# Patient Record
Sex: Female | Born: 1985
Health system: Southern US, Community
[De-identification: ages and names within clinical notes are randomized; demographics above are authoritative.]

## PROBLEM LIST (undated history)

## (undated) ENCOUNTER — Inpatient Hospital Stay (HOSPITAL_COMMUNITY): Payer: Self-pay

## (undated) ENCOUNTER — Emergency Department (HOSPITAL_COMMUNITY): Admission: EM | Payer: Commercial Indemnity | Source: Home / Self Care

## (undated) DIAGNOSIS — K219 Gastro-esophageal reflux disease without esophagitis: Secondary | ICD-10-CM

## (undated) DIAGNOSIS — E669 Obesity, unspecified: Secondary | ICD-10-CM

## (undated) DIAGNOSIS — E0500 Thyrotoxicosis with diffuse goiter without thyrotoxic crisis or storm: Secondary | ICD-10-CM

## (undated) DIAGNOSIS — D649 Anemia, unspecified: Secondary | ICD-10-CM

## (undated) DIAGNOSIS — G4730 Sleep apnea, unspecified: Secondary | ICD-10-CM

## (undated) DIAGNOSIS — F419 Anxiety disorder, unspecified: Secondary | ICD-10-CM

## (undated) HISTORY — PX: CHOLECYSTECTOMY: SHX55

## (undated) HISTORY — DX: Sleep apnea, unspecified: G47.30

## (undated) HISTORY — DX: Anxiety disorder, unspecified: F41.9

## (undated) HISTORY — DX: Anemia, unspecified: D64.9

## (undated) HISTORY — PX: TONSILLECTOMY AND ADENOIDECTOMY: SHX28

## (undated) HISTORY — PX: WISDOM TOOTH EXTRACTION: SHX21

## (undated) HISTORY — PX: LAPAROSCOPIC GASTRIC SLEEVE RESECTION: SHX5895

## (undated) HISTORY — PX: TONSILLECTOMY: SUR1361

## (undated) HISTORY — DX: Gastro-esophageal reflux disease without esophagitis: K21.9

---
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
## 2007-03-04 ENCOUNTER — Emergency Department (HOSPITAL_COMMUNITY): Admission: EM | Admit: 2007-03-04 | Discharge: 2007-03-04 | Payer: Self-pay | Admitting: *Deleted

## 2007-03-06 ENCOUNTER — Encounter (HOSPITAL_COMMUNITY): Admission: RE | Admit: 2007-03-06 | Discharge: 2007-04-03 | Payer: Self-pay | Admitting: *Deleted

## 2007-04-02 ENCOUNTER — Encounter (INDEPENDENT_AMBULATORY_CARE_PROVIDER_SITE_OTHER): Payer: Self-pay | Admitting: Surgery

## 2007-04-02 ENCOUNTER — Ambulatory Visit (HOSPITAL_COMMUNITY): Admission: RE | Admit: 2007-04-02 | Discharge: 2007-04-02 | Payer: Self-pay | Admitting: Surgery

## 2008-01-13 ENCOUNTER — Other Ambulatory Visit: Admission: RE | Admit: 2008-01-13 | Discharge: 2008-01-13 | Payer: Self-pay | Admitting: Obstetrics and Gynecology

## 2008-04-26 IMAGING — NM NM HEPATO W/GB/PHARM/[PERSON_NAME]
2 series · 12 of 12 positions shown · non-contrast
Comparison: none

HISTORY: Right upper quadrant pain

[Series 1: hepatobiliary · 3.20mm/px · 6 of 60 frames shown (1 of 2)]
[frame 6/60]
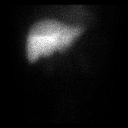
[frame 16/60]
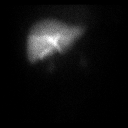
[frame 26/60]
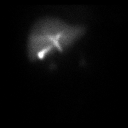
[frame 36/60]
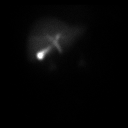
[frame 46/60]
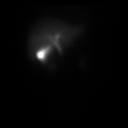
[frame 56/60]
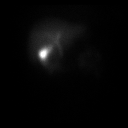

[Series 1: hepatobiliary · 3.20mm/px · 6 of 60 frames shown (2 of 2)]
[frame 6/60]
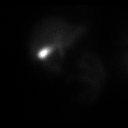
[frame 16/60]
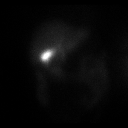
[frame 26/60]
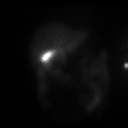
[frame 36/60]
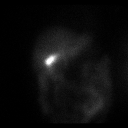
[frame 46/60]
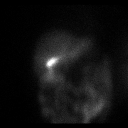
[frame 56/60]
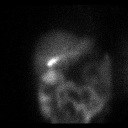

[12 of 12 positions shown; findings below may reference images not displayed]

HEPATOBILIARY SCAN WITH EJECTION FRACTION:

Hepatobiliary imaging performed using 5 mCi 6c-HHm mebrofenin.

Prompt tracer extraction from blood stream, indicating normal hepatocellular
function.
Prompt excretion of tracer into biliary tree.
Gallbladder visualized x 22 minutes.
Small bowel activity identified x 53 minutes. 
No focal hepatic retention of tracer.

At 1 hour, patient ingested half-and-half, and imaging was continued for 60
minutes.
Normal emptying of tracer occurs from gallbladder following fatty meal
stimulation.
Calculated gallbladder ejection fraction 60%, normal.
IMPRESSION: Normal exam.

## 2009-03-24 ENCOUNTER — Other Ambulatory Visit: Admission: RE | Admit: 2009-03-24 | Discharge: 2009-03-24 | Payer: Self-pay | Admitting: Obstetrics and Gynecology

## 2010-05-13 ENCOUNTER — Other Ambulatory Visit (HOSPITAL_COMMUNITY)
Admission: RE | Admit: 2010-05-13 | Discharge: 2010-05-13 | Disposition: A | Discharge status: Home / Self Care | Payer: Commercial Indemnity | Source: Ambulatory Visit | Attending: Obstetrics and Gynecology | Admitting: Obstetrics and Gynecology

## 2010-05-13 ENCOUNTER — Other Ambulatory Visit: Payer: Self-pay | Admitting: Obstetrics and Gynecology

## 2010-05-13 DIAGNOSIS — Z01419 Encounter for gynecological examination (general) (routine) without abnormal findings: Secondary | ICD-10-CM | POA: Insufficient documentation

## 2010-05-13 DIAGNOSIS — Z113 Encounter for screening for infections with a predominantly sexual mode of transmission: Secondary | ICD-10-CM | POA: Insufficient documentation

## 2010-08-16 NOTE — Op Note (Signed)
Jennifer Rosales, Jennifer Rosales               ACCOUNT NO.:  [id number]   MEDICAL RECORD NO.:  [id number]          PATIENT TYPE:  AMB   LOCATION:  SDS                          FACILITY:  [facility]   PHYSICIAN:  Ardeth Sportsman, MD     DATE OF BIRTH:  1985-11-11   DATE OF PROCEDURE:  DATE OF DISCHARGE:                               OPERATIVE REPORT   PRIMARY CARE PHYSICIAN:  Bennie Pierini, Nurse Practitioner for  Danise Edge at [facility] at [place].   SURGEON:  Karie Soda, M.D.   PREOPERATIVE DIAGNOSIS:  Biliary dyskinesia.   POSTOPERATIVE DIAGNOSIS:  1. Biliary dyskinesia.  2. Fatty steatohepatosis.   PROCEDURE PERFORMED:  Laparoscopic cholecystectomy   ANESTHESIA:  1. General anesthesia.  2. Local anesthetic in a field block around port sites.   SPECIMEN:  Gallbladder.   DRAINS:  None.   ESTIMATED BLOOD LOSS:  Less than 10 ml.   COMPLICATIONS:  None apparent.   INDICATIONS:  Ms. Cliffton Asters is a 21-year female with a classic story of  biliary colic with a documented decreased gallbladder ejection fraction  of 20% with reproduction of symptoms on HIDA scan.  Differential  diagnoses were discussed.  Options were discussed, recommendation was  made for a laparoscopic cholecystectomy with possible intraoperative  cholangiogram.  Risks such as stroke, heart attack, deep venous  thrombosis, pulmonary embolism and death were discussed.  Risks such as  bleeding, need for transfusion, wound infection, abscess, injury to  other organs, prolonged pain and other risks were discussed.  Questions  were answered and she agreed to proceed.   OPERATIVE FINDINGS:  She had some fatty steatohepatosis of her liver  that was mild to moderate.  Her gallbladder appeared normal.  Her cystic  duct was extremely narrow and fibrotic and I could not get a 5-French  cholangiocatheter to pass more proximally into the common bile duct.  Given the normal liver function tests, I did not force  it and aborted on  cholangiogram.   PROCEDURE:  Informed consent was confirmed.  The patient had sequential  compression devices active during the entire case.  She voided just  prior to going to the operating room.  She was positioned supine, both  arms tucked.  She underwent general esthesia without any difficulty.  Her abdomen was prepped and draped in sterile fashion.   Entry was gained in the abdomen with placement of a 5-mm port in the  right upper quadrant using optical entry technique with the patient in  steep reversed Trendelenburg and right side up.  Capnoperitoneum 15  mmHg, provided good abdominal insufflation.  Under direct visualization,  a 5-mm port was placed in the right lateral flank and supraumbilically  through a prior supraumbilical incision from a prior attempted  bellybutton piercing.  A 10-mm port was tunneled through the falciform  ligament just right of the subxiphoid region.   The gallbladder fundus was grasped and elevated cephalad.  Peritoneal  coverings between the gallbladder and the liver were freed off in the  anteromedial postal aspects.  There were small nicks and there was some  drainage.  There was clear bile, but no stones were apparent.  I  aspirated the gallbladder for a fully decompressed gallbladder.  Circumferential dissection was done at the infundibulum to reveal just  two structures going from the gallbladder down to the porta hepatis  consistent with a classic critical view.  One clip on the gallbladder  side, two clips slightly proximal remained on the cystic artery and  transection was completed.  A small branch from the cystic artery going  to the cystic duct was clipped and transected as well.  This just left  one structure going down to the porta hepatis consistent a cystic duct.   A clip was made on the infundibulum and a partial cysticotomy was  performed.  I ended up having to do a nearly complete transection of it.  I could get  a hook catheter to be able to pass more distally up into the  infundibulum well but I could barely get it to go more proximally.  After some careful gentle dilation, I felt like I could be able to do a  cholangiogram.  A 5-French cholangiogram catheter was placed in the  right subcostal puncture site, flushed and attempted to pass into the  cystic duct.  Despite numerous maneuverings and attempts, I could not  get it through that.  Actually, I ended up back walling  through the  posterior wall of cystic duct that remainder.  Given the fact she had  normal liver function tests, and the anatomy was very easy to see and I  had a good critical view, I aborted on cholangiogram to avoid further  injury.  Therefore, four clips remained on the cystic duct just slightly  proximal and the partial cysticotomy and cystic duct transection were  completed.   The gallbladder was freed from its main attachments on the liver and  brought out inside the subxiphoid port after being completely  decompressed.  Hemostasis was assured in liver bed.  Clips were intact,  in the cystic duct and arterial stumps.  Over a liter of irrigation was  done with clear return and there was no evidence of bile contamination,  no leaking of blood.  Gas was aspirated and ports were removed.  The  fascial defect in the subxiphoid region was too small to allow my pinky  to pass and was tunnelled within the falciform ligament, so I did not do  any more aggressive closure since it was a dilating trocar.  Skin was  closed using 4-0 Monocryl stitch.  Sterile dressings were applied.  The  patient was extubated, sent to the recovery room in stable condition.   I explained the operative findings to the patient's mother,  postoperative instructions had been discussed with the patient and her  mother just prior to surgery and reinforced with the mother after  surgery.  She expressed her appreciation.      Ardeth Sportsman, MD   Electronically Signed     SCG/MEDQ  D:  04/02/2007  T:  04/02/2007  Job:  [id number]   cc:   Ardeth Sportsman, MD

## 2011-01-06 LAB — CBC
HCT: 41.1
Hemoglobin: 13.8
MCHC: 33.5
MCV: 79.8
Platelets: 320
RBC: 5.15 — ABNORMAL HIGH
RDW: 14.7
WBC: 7.7

## 2011-07-20 ENCOUNTER — Other Ambulatory Visit: Payer: Self-pay | Admitting: Obstetrics and Gynecology

## 2011-07-20 ENCOUNTER — Other Ambulatory Visit (HOSPITAL_COMMUNITY)
Admission: RE | Admit: 2011-07-20 | Discharge: 2011-07-20 | Disposition: A | Discharge status: Home / Self Care | Payer: 59 | Source: Ambulatory Visit | Attending: Obstetrics and Gynecology | Admitting: Obstetrics and Gynecology

## 2011-07-20 DIAGNOSIS — Z01419 Encounter for gynecological examination (general) (routine) without abnormal findings: Secondary | ICD-10-CM | POA: Insufficient documentation

## 2011-07-20 DIAGNOSIS — Z113 Encounter for screening for infections with a predominantly sexual mode of transmission: Secondary | ICD-10-CM | POA: Insufficient documentation

## 2012-07-25 ENCOUNTER — Other Ambulatory Visit: Payer: Self-pay | Admitting: Obstetrics and Gynecology

## 2012-07-25 ENCOUNTER — Other Ambulatory Visit (HOSPITAL_COMMUNITY)
Admission: RE | Admit: 2012-07-25 | Discharge: 2012-07-25 | Disposition: A | Discharge status: Home / Self Care | Payer: 59 | Source: Ambulatory Visit | Attending: Obstetrics and Gynecology | Admitting: Obstetrics and Gynecology

## 2012-07-25 DIAGNOSIS — Z01419 Encounter for gynecological examination (general) (routine) without abnormal findings: Secondary | ICD-10-CM | POA: Insufficient documentation

## 2012-07-25 DIAGNOSIS — Z113 Encounter for screening for infections with a predominantly sexual mode of transmission: Secondary | ICD-10-CM | POA: Insufficient documentation

## 2012-08-13 ENCOUNTER — Other Ambulatory Visit (HOSPITAL_COMMUNITY)
Admission: RE | Admit: 2012-08-13 | Discharge: 2012-08-13 | Disposition: A | Discharge status: Home / Self Care | Payer: 59 | Source: Ambulatory Visit | Attending: Obstetrics and Gynecology | Admitting: Obstetrics and Gynecology

## 2012-12-06 LAB — OB RESULTS CONSOLE RPR: RPR: NONREACTIVE

## 2012-12-06 LAB — OB RESULTS CONSOLE ABO/RH: RH Type: POSITIVE

## 2012-12-06 LAB — OB RESULTS CONSOLE RUBELLA ANTIBODY, IGM: Rubella: IMMUNE

## 2012-12-06 LAB — OB RESULTS CONSOLE HIV ANTIBODY (ROUTINE TESTING): HIV: NONREACTIVE

## 2012-12-06 LAB — OB RESULTS CONSOLE ANTIBODY SCREEN: Antibody Screen: NEGATIVE

## 2012-12-06 LAB — OB RESULTS CONSOLE HEPATITIS B SURFACE ANTIGEN: Hepatitis B Surface Ag: NEGATIVE

## 2013-01-03 LAB — OB RESULTS CONSOLE GC/CHLAMYDIA
Chlamydia: NEGATIVE
Gonorrhea: NEGATIVE

## 2013-04-03 NOTE — L&D Delivery Note (Signed)
Delivery Note At 5:54 PM a viable female was delivered via Vaginal, Spontaneous Delivery (Presentation: ; Occiput Anterior).  Nuchal cord x 1 reduced Mouth and nose bulb suctioned.  APGAR: 9, 9; weight .   Placenta status: , .  Cord: 3 vessels with the following complications: None.  Cord pH: NA  Anesthesia: Epidural  Episiotomy: None Lacerations: 2nd degree;Perineal Suture Repair: 3.0 vicryl Est. Blood Loss (mL): 800 mL  Methergine 0.2 mg IM and 1000 mcg of cytotec per rectum for PPH  Mom to postpartum.  Baby to Couplet care / Skin to Skin.  Dorien Chihuahuaara J. Amayah Staheli 07/30/2013, 6:29 PM

## 2013-06-15 ENCOUNTER — Inpatient Hospital Stay (HOSPITAL_COMMUNITY)
Admission: AD | Admit: 2013-06-15 | Discharge: 2013-06-15 | Disposition: A | Discharge status: Home / Self Care | Payer: Commercial Managed Care - PPO | Source: Ambulatory Visit | Attending: Obstetrics and Gynecology | Admitting: Obstetrics and Gynecology

## 2013-06-15 ENCOUNTER — Encounter (HOSPITAL_COMMUNITY): Payer: Self-pay | Admitting: *Deleted

## 2013-06-15 DIAGNOSIS — O4700 False labor before 37 completed weeks of gestation, unspecified trimester: Secondary | ICD-10-CM | POA: Insufficient documentation

## 2013-06-15 DIAGNOSIS — M545 Low back pain, unspecified: Secondary | ICD-10-CM | POA: Insufficient documentation

## 2013-06-15 DIAGNOSIS — O368190 Decreased fetal movements, unspecified trimester, not applicable or unspecified: Secondary | ICD-10-CM | POA: Insufficient documentation

## 2013-06-15 HISTORY — DX: Obesity, unspecified: E66.9

## 2013-06-15 LAB — URINALYSIS, ROUTINE W REFLEX MICROSCOPIC
Bilirubin Urine: NEGATIVE
Glucose, UA: NEGATIVE mg/dL
Hgb urine dipstick: NEGATIVE
Ketones, ur: NEGATIVE mg/dL
Nitrite: NEGATIVE
Protein, ur: NEGATIVE mg/dL
Specific Gravity, Urine: 1.01 (ref 1.005–1.030)
Urobilinogen, UA: 0.2 mg/dL (ref 0.0–1.0)
pH: 7.5 (ref 5.0–8.0)

## 2013-06-15 LAB — WET PREP, GENITAL
Clue Cells Wet Prep HPF POC: NONE SEEN
Trich, Wet Prep: NONE SEEN
Yeast Wet Prep HPF POC: NONE SEEN

## 2013-06-15 LAB — URINE MICROSCOPIC-ADD ON

## 2013-06-15 MED ORDER — LACTATED RINGERS IV BOLUS (SEPSIS): 1000.0000 mL | Freq: Once | INTRAVENOUS | Status: DC

## 2013-06-15 MED ORDER — LACTATED RINGERS IV BOLUS (SEPSIS)
1000.0000 mL | Freq: Once | INTRAVENOUS | Status: AC
  Administered 2013-06-15: 1000 mL via INTRAVENOUS

## 2013-06-15 MED ORDER — NIFEDIPINE 10 MG PO CAPS: 10.0000 mg | ORAL_CAPSULE | Freq: Four times a day (QID) | ORAL | Status: DC | PRN

## 2013-06-15 MED ORDER — NIFEDIPINE 10 MG PO CAPS
10.0000 mg | ORAL_CAPSULE | Freq: Once | ORAL | Status: AC
  Administered 2013-06-15: 10 mg via ORAL
  Filled 2013-06-15: qty 1

## 2013-06-15 NOTE — MAU Provider Note (Signed)
History     CSN: [id number]  Arrival date and time: 06/15/13 1129   First Provider Initiated Contact with Patient 06/15/13 1215      Chief Complaint  Patient presents with  . Back Pain  . Decreased Fetal Movement   HPI  Ms. Jennifer Rosales is a 28 y.o. female G1P0 at [redacted]w[redacted]d who presents with lower back pain and decreased fetal movement. The back pain started last night and this morning she woke up with pelvic pain. She was feeling the pelvic pain every 15 mins. Since she has been here in MAU she has felt the baby move.  Currently she rates her back pain 5/10; pt rates the pelvic pain 5/10. She has not taken anything for pain; pt drinks a good amount of water during the day.  Pt had 6 episodes of soft, diarrhea yesterday; none today.   OB History   Grav Para Term Preterm Abortions TAB SAB Ect Mult Living   1               History reviewed. No pertinent past medical history.  History reviewed. No pertinent past surgical history.  History reviewed. No pertinent family history.  History  Substance Use Topics  . Smoking status: Never Smoker   . Smokeless tobacco: Not on file  . Alcohol Use: Not on file    Allergies: No Known Allergies  Prescriptions prior to admission  Medication Sig Dispense Refill  . Prenatal Vit-Fe Fumarate-FA (PRENATAL MULTIVITAMIN) TABS tablet Take 1 tablet by mouth daily at 12 noon.       Results for orders placed during the hospital encounter of 06/15/13 (from the past 48 hour(s))  URINALYSIS, ROUTINE W REFLEX MICROSCOPIC     Status: Abnormal   Collection Time    06/15/13 11:50 AM      Result Value Ref Range   Color, Urine YELLOW  YELLOW   APPearance CLEAR  CLEAR   Specific Gravity, Urine 1.010  1.005 - 1.030   pH 7.5  5.0 - 8.0   Glucose, UA NEGATIVE  NEGATIVE mg/dL   Hgb urine dipstick NEGATIVE  NEGATIVE   Bilirubin Urine NEGATIVE  NEGATIVE   Ketones, ur NEGATIVE  NEGATIVE mg/dL   Protein, ur NEGATIVE  NEGATIVE mg/dL   Urobilinogen, UA 0.2   0.0 - 1.0 mg/dL   Nitrite NEGATIVE  NEGATIVE   Leukocytes, UA TRACE (*) NEGATIVE  URINE MICROSCOPIC-ADD ON     Status: Abnormal   Collection Time    06/15/13 11:50 AM      Result Value Ref Range   Squamous Epithelial / LPF FEW (*) RARE   WBC, UA 0-2  <3 WBC/hpf   Bacteria, UA FEW (*) RARE  WET PREP, GENITAL     Status: Abnormal   Collection Time    06/15/13  1:50 PM      Result Value Ref Range   Yeast Wet Prep HPF POC NONE SEEN  NONE SEEN   Trich, Wet Prep NONE SEEN  NONE SEEN   Clue Cells Wet Prep HPF POC NONE SEEN  NONE SEEN   WBC, Wet Prep HPF POC FEW (*) NONE SEEN   Comment: MANY BACTERIA SEEN    Review of Systems  Genitourinary: Negative for dysuria, urgency, frequency and hematuria.       No vaginal discharge. No vaginal bleeding. No dysuria.   Musculoskeletal: Positive for back pain (More in the left side of her back and the center. The pain comes and goes. ).  Physical Exam   Blood pressure 104/56, pulse 84, temperature 98.4 F (36.9 C), resp. rate 18, height 5\' 4"  (1.626 m), weight 131.09 kg (289 lb).  Physical Exam  Constitutional: She appears well-developed and well-nourished. No distress.  HENT:  Head: Normocephalic.  Eyes: Pupils are equal, round, and reactive to light.  Neck: Neck supple.  GI: She exhibits no distension. There is no tenderness. There is no rebound, no guarding and no CVA tenderness.  Genitourinary:  Dilation: Fingertip Effacement (%): 50 Cervical Position: Anterior Presentation: Undeterminable Exam by:: J. Malaiyah Achorn NP  Skin: She is not diaphoretic.    Fetal Tracing: Baseline: 135 bpm  Variability: Moderate  Accelerations: 15x15 Decelerations: None Toco: Mild contractions with UI; Q 4-6 mins apart   Fetal Tracing: 1500 Baseline: 140 bpm  Variability: Moderate- Marked   Accelerations: 15x15 Decelerations: None Toco: Occasional UI     1530 cervical exam; unchanged from previous exam   MAU Course   Procedures None  MDM UA NST  Consulted with Dr. Richardson Doppole at 1325 Wet prep 1 Liter bolus  Procardia 10 mg PO  Consulted with Dr. Richardson Doppole at 1530 Pt feels the contractions have spaced out tremendously. She states she continues to feel an occasional contraction, however less frequently than prior.   Assessment and Plan   Assessment:  1. Threatened preterm labor     Plan:  Discharge home in stable condition RX: Procardia PRN  Call the office tomorrow if the pain resumes  Preterm labor precautions discussed Pelvic rest Kick counts   Jennifer HansenJennifer Irene Delaney Schnick, NP  06/15/2013, 4:00 PM

## 2013-06-15 NOTE — MAU Note (Signed)
Pt presents with complaints of lower back and pelvic pain for a couple of days and noticed a decrease in fetal movement today.

## 2013-06-15 NOTE — Discharge Instructions (Signed)
Pelvic Rest °Pelvic rest is sometimes recommended for women when:  °· The placenta is partially or completely covering the opening of the cervix (placenta previa). °· There is bleeding between the uterine wall and the amniotic sac in the first trimester (subchorionic hemorrhage). °· The cervix begins to open without labor starting (incompetent cervix, cervical insufficiency). °· The labor is too early (preterm labor). °HOME CARE INSTRUCTIONS °· Do not have sexual intercourse, stimulation, or an orgasm. °· Do not use tampons, douche, or put anything in the vagina. °· Do not lift anything over 10 pounds (4.5 kg). °· Avoid strenuous activity or straining your pelvic muscles. °SEEK MEDICAL CARE IF:  °· You have any vaginal bleeding during pregnancy. Treat this as a potential emergency. °· You have cramping pain felt low in the stomach (stronger than menstrual cramps). °· You notice vaginal discharge (watery, mucus, or bloody). °· You have a low, dull backache. °· There are regular contractions or uterine tightening. °SEEK IMMEDIATE MEDICAL CARE IF: °You have vaginal bleeding and have placenta previa.  °Document Released: 07/15/2010 Document Revised: 06/12/2011 Document Reviewed: 07/15/2010 °ExitCare® Patient Information ©2014 [employer]. ° °

## 2013-06-28 LAB — OB RESULTS CONSOLE GBS: GBS: NEGATIVE

## 2013-07-14 ENCOUNTER — Encounter (HOSPITAL_COMMUNITY): Payer: Self-pay | Admitting: *Deleted

## 2013-07-14 ENCOUNTER — Inpatient Hospital Stay (HOSPITAL_COMMUNITY)
Admission: AD | Admit: 2013-07-14 | Discharge: 2013-07-14 | Disposition: A | Discharge status: Home / Self Care | Payer: Commercial Managed Care - PPO | Source: Ambulatory Visit | Attending: Obstetrics and Gynecology | Admitting: Obstetrics and Gynecology

## 2013-07-14 DIAGNOSIS — O9989 Other specified diseases and conditions complicating pregnancy, childbirth and the puerperium: Principal | ICD-10-CM

## 2013-07-14 DIAGNOSIS — O368190 Decreased fetal movements, unspecified trimester, not applicable or unspecified: Secondary | ICD-10-CM | POA: Insufficient documentation

## 2013-07-14 DIAGNOSIS — R109 Unspecified abdominal pain: Secondary | ICD-10-CM | POA: Insufficient documentation

## 2013-07-14 DIAGNOSIS — O99891 Other specified diseases and conditions complicating pregnancy: Secondary | ICD-10-CM | POA: Insufficient documentation

## 2013-07-14 NOTE — Discharge Instructions (Signed)
Fetal Movement Counts °Patient Name: __________________________________________________ Patient Due Date: ____________________ °Performing a fetal movement count is highly recommended in high-risk pregnancies, but it is good for every pregnant woman to do. Your caregiver may ask you to start counting fetal movements at 28 weeks of the pregnancy. Fetal movements often increase: °· After eating a full meal. °· After physical activity. °· After eating or drinking something sweet or cold. °· At rest. °Pay attention to when you feel the baby is most active. This will help you notice a pattern of your baby's sleep and wake cycles and what factors contribute to an increase in fetal movement. It is important to perform a fetal movement count at the same time each day when your baby is normally most active.  °HOW TO COUNT FETAL MOVEMENTS °1. Find a quiet and comfortable area to sit or lie down on your left side. Lying on your left side provides the best blood and oxygen circulation to your baby. °2. Write down the day and time on a sheet of paper or in a journal. °3. Start counting kicks, flutters, swishes, rolls, or jabs in a 2 hour period. You should feel at least 10 movements within 2 hours. °4. If you do not feel 10 movements in 2 hours, wait 2 3 hours and count again. Look for a change in the pattern or not enough counts in 2 hours. °SEEK MEDICAL CARE IF: °· You feel less than 10 counts in 2 hours, tried twice. °· There is no movement in over an hour. °· The pattern is changing or taking longer each day to reach 10 counts in 2 hours. °· You feel the baby is not moving as he or she usually does. °Date: ____________ Movements: ____________ Start time: ____________ Finish time: ____________  °Date: ____________ Movements: ____________ Start time: ____________ Finish time: ____________ °Date: ____________ Movements: ____________ Start time: ____________ Finish time: ____________ °Date: ____________ Movements: ____________  Start time: ____________ Finish time: ____________ °Date: ____________ Movements: ____________ Start time: ____________ Finish time: ____________ °Date: ____________ Movements: ____________ Start time: ____________ Finish time: ____________ °Date: ____________ Movements: ____________ Start time: ____________ Finish time: ____________ °Date: ____________ Movements: ____________ Start time: ____________ Finish time: ____________  °Date: ____________ Movements: ____________ Start time: ____________ Finish time: ____________ °Date: ____________ Movements: ____________ Start time: ____________ Finish time: ____________ °Date: ____________ Movements: ____________ Start time: ____________ Finish time: ____________ °Date: ____________ Movements: ____________ Start time: ____________ Finish time: ____________ °Date: ____________ Movements: ____________ Start time: ____________ Finish time: ____________ °Date: ____________ Movements: ____________ Start time: ____________ Finish time: ____________ °Date: ____________ Movements: ____________ Start time: ____________ Finish time: ____________  °Date: ____________ Movements: ____________ Start time: ____________ Finish time: ____________ °Date: ____________ Movements: ____________ Start time: ____________ Finish time: ____________ °Date: ____________ Movements: ____________ Start time: ____________ Finish time: ____________ °Date: ____________ Movements: ____________ Start time: ____________ Finish time: ____________ °Date: ____________ Movements: ____________ Start time: ____________ Finish time: ____________ °Date: ____________ Movements: ____________ Start time: ____________ Finish time: ____________ °Date: ____________ Movements: ____________ Start time: ____________ Finish time: ____________  °Date: ____________ Movements: ____________ Start time: ____________ Finish time: ____________ °Date: ____________ Movements: ____________ Start time: ____________ Finish time:  ____________ °Date: ____________ Movements: ____________ Start time: ____________ Finish time: ____________ °Date: ____________ Movements: ____________ Start time: ____________ Finish time: ____________ °Date: ____________ Movements: ____________ Start time: ____________ Finish time: ____________ °Date: ____________ Movements: ____________ Start time: ____________ Finish time: ____________ °Date: ____________ Movements: ____________ Start time: ____________ Finish time: ____________  °Date: ____________ Movements: ____________ Start time: ____________ Finish   time: ____________ °Date: ____________ Movements: ____________ Start time: ____________ Finish time: ____________ °Date: ____________ Movements: ____________ Start time: ____________ Finish time: ____________ °Date: ____________ Movements: ____________ Start time: ____________ Finish time: ____________ °Date: ____________ Movements: ____________ Start time: ____________ Finish time: ____________ °Date: ____________ Movements: ____________ Start time: ____________ Finish time: ____________ °Date: ____________ Movements: ____________ Start time: ____________ Finish time: ____________  °Date: ____________ Movements: ____________ Start time: ____________ Finish time: ____________ °Date: ____________ Movements: ____________ Start time: ____________ Finish time: ____________ °Date: ____________ Movements: ____________ Start time: ____________ Finish time: ____________ °Date: ____________ Movements: ____________ Start time: ____________ Finish time: ____________ °Date: ____________ Movements: ____________ Start time: ____________ Finish time: ____________ °Date: ____________ Movements: ____________ Start time: ____________ Finish time: ____________ °Date: ____________ Movements: ____________ Start time: ____________ Finish time: ____________  °Date: ____________ Movements: ____________ Start time: ____________ Finish time: ____________ °Date: ____________ Movements:  ____________ Start time: ____________ Finish time: ____________ °Date: ____________ Movements: ____________ Start time: ____________ Finish time: ____________ °Date: ____________ Movements: ____________ Start time: ____________ Finish time: ____________ °Date: ____________ Movements: ____________ Start time: ____________ Finish time: ____________ °Date: ____________ Movements: ____________ Start time: ____________ Finish time: ____________ °Date: ____________ Movements: ____________ Start time: ____________ Finish time: ____________  °Date: ____________ Movements: ____________ Start time: ____________ Finish time: ____________ °Date: ____________ Movements: ____________ Start time: ____________ Finish time: ____________ °Date: ____________ Movements: ____________ Start time: ____________ Finish time: ____________ °Date: ____________ Movements: ____________ Start time: ____________ Finish time: ____________ °Date: ____________ Movements: ____________ Start time: ____________ Finish time: ____________ °Date: ____________ Movements: ____________ Start time: ____________ Finish time: ____________ °Document Released: 04/19/2006 Document Revised: 03/06/2012 Document Reviewed: 01/15/2012 °[employer]Care® Patient Information ©2014 [employer]. ° ° °Braxton Hicks Contractions °Pregnancy is commonly associated with contractions of the uterus throughout the pregnancy. Towards the end of pregnancy (32 to 34 weeks), these contractions (Braxton Hicks) can develop more often and may become more forceful. This is not true labor because these contractions do not result in opening (dilatation) and thinning of the cervix. They are sometimes difficult to tell apart from true labor because these contractions can be forceful and people have different pain tolerances. You should not feel embarrassed if you go to the hospital with false labor. Sometimes, the only way to tell if you are in true labor is for your caregiver to follow the changes  in the cervix. °How to tell the difference between true and false labor: °· False labor. °· The contractions of false labor are usually shorter, irregular and not as hard as those of true labor. °· They are often felt in the front of the lower abdomen and in the groin. °· They may leave with walking around or changing positions while lying down. °· They get weaker and are shorter lasting as time goes on. °· These contractions are usually irregular. °· They do not usually become progressively stronger, regular and closer together as with true labor. °· True labor. °· Contractions in true labor last 30 to 70 seconds, become very regular, usually become more intense, and increase in frequency. °· They do not go away with walking. °· The discomfort is usually felt in the top of the uterus and spreads to the lower abdomen and low back. °· True labor can be determined by your caregiver with an exam. This will show that the cervix is dilating and getting thinner. °If there are no prenatal problems or other health problems associated with the pregnancy, it is completely safe to be sent home with false labor and await the onset of   true labor. °HOME CARE INSTRUCTIONS  °· Keep up with your usual exercises and instructions. °· Take medications as directed. °· Keep your regular prenatal appointment. °· Eat and drink lightly if you think you are going into labor. °· If BH contractions are making you uncomfortable: °· Change your activity position from lying down or resting to walking/walking to resting. °· Sit and rest in a tub of warm water. °· Drink 2 to 3 glasses of water. Dehydration may cause B-H contractions. °· Do slow and deep breathing several times an hour. °SEEK IMMEDIATE MEDICAL CARE IF:  °· Your contractions continue to become stronger, more regular, and closer together. °· You have a gushing, burst or leaking of fluid from the vagina. °· An oral temperature above 102° F (38.9° C) develops. °· You have passage of  blood-tinged mucus. °· You develop vaginal bleeding. °· You develop continuous belly (abdominal) pain. °· You have low back pain that you never had before. °· You feel the baby's head pushing down causing pelvic pressure. °· The baby is not moving as much as it used to. °Document Released: 03/20/2005 Document Revised: 06/12/2011 Document Reviewed: 12/30/2012 °[employer]® Patient Information ©2014 [employer]. ° ° °

## 2013-07-14 NOTE — Progress Notes (Signed)
Baby moving but pt does not always feel the movement. States some movements do not feel as strong as usual.

## 2013-07-14 NOTE — MAU Note (Signed)
Patient states she has been having sharp lower abdominal pain for a while but has been worse today. States she has only felt fetal movement twice in the last hour. Denies bleeding or leaking.

## 2013-07-14 NOTE — Progress Notes (Signed)
Venia CarbonJennifer Rasch Np

## 2013-07-14 NOTE — Progress Notes (Signed)
Written and verbal d/c instructions given and understanding voiced. 

## 2013-07-14 NOTE — Progress Notes (Signed)
Venia CarbonJennifer Rasch NP in earlier to do EMs and reviewed FHR strip

## 2013-07-14 NOTE — Progress Notes (Signed)
Dr Dion BodyVarnado called in. Made aware of pt's admission, reactive FHR and strip reviewed by Blanche EastJ Rasch NP who did MSE. Aware of SVE, and baby moving well. Pt stable for d/c home

## 2013-07-18 ENCOUNTER — Telehealth (HOSPITAL_COMMUNITY): Payer: Self-pay | Admitting: *Deleted

## 2013-07-18 ENCOUNTER — Encounter (HOSPITAL_COMMUNITY): Payer: Self-pay | Admitting: *Deleted

## 2013-07-18 NOTE — Telephone Encounter (Signed)
Preadmission screen  

## 2013-07-28 ENCOUNTER — Other Ambulatory Visit: Payer: Self-pay | Admitting: Obstetrics and Gynecology

## 2013-07-29 ENCOUNTER — Encounter (HOSPITAL_COMMUNITY): Payer: Self-pay

## 2013-07-29 ENCOUNTER — Inpatient Hospital Stay (HOSPITAL_COMMUNITY)
Admission: RE | Admit: 2013-07-29 | Discharge: 2013-08-01 | DRG: 774 | Disposition: A | Discharge status: Home / Self Care | Payer: Commercial Managed Care - PPO | Source: Ambulatory Visit | Attending: Obstetrics and Gynecology | Admitting: Obstetrics and Gynecology

## 2013-07-29 DIAGNOSIS — O99214 Obesity complicating childbirth: Secondary | ICD-10-CM

## 2013-07-29 DIAGNOSIS — D649 Anemia, unspecified: Secondary | ICD-10-CM | POA: Diagnosis present

## 2013-07-29 DIAGNOSIS — Z8249 Family history of ischemic heart disease and other diseases of the circulatory system: Secondary | ICD-10-CM | POA: Diagnosis not present

## 2013-07-29 DIAGNOSIS — O9902 Anemia complicating childbirth: Secondary | ICD-10-CM | POA: Diagnosis present

## 2013-07-29 DIAGNOSIS — Z833 Family history of diabetes mellitus: Secondary | ICD-10-CM

## 2013-07-29 DIAGNOSIS — Z6841 Body Mass Index (BMI) 40.0 and over, adult: Secondary | ICD-10-CM | POA: Diagnosis not present

## 2013-07-29 DIAGNOSIS — O480 Post-term pregnancy: Secondary | ICD-10-CM | POA: Diagnosis present

## 2013-07-29 DIAGNOSIS — E669 Obesity, unspecified: Secondary | ICD-10-CM | POA: Diagnosis present

## 2013-07-29 LAB — CBC
HCT: 34.9 % — ABNORMAL LOW (ref 36.0–46.0)
Hemoglobin: 11.5 g/dL — ABNORMAL LOW (ref 12.0–15.0)
MCH: 27.4 pg (ref 26.0–34.0)
MCHC: 33 g/dL (ref 30.0–36.0)
MCV: 83.3 fL (ref 78.0–100.0)
Platelets: 212 10*3/uL (ref 150–400)
RBC: 4.19 MIL/uL (ref 3.87–5.11)
RDW: 14.6 % (ref 11.5–15.5)
WBC: 10.9 10*3/uL — ABNORMAL HIGH (ref 4.0–10.5)

## 2013-07-29 MED ORDER — FENTANYL 2.5 MCG/ML BUPIVACAINE 1/10 % EPIDURAL INFUSION (WH - ANES)
14.0000 mL/h | INTRAMUSCULAR | Status: DC | PRN
  Filled 2013-07-29: qty 125

## 2013-07-29 MED ORDER — LACTATED RINGERS IV SOLN
INTRAVENOUS | Status: DC
  Administered 2013-07-30: 12:00:00 via INTRAVENOUS

## 2013-07-29 MED ORDER — OXYCODONE-ACETAMINOPHEN 5-325 MG PO TABS: 1.0000 | ORAL_TABLET | ORAL | Status: DC | PRN

## 2013-07-29 MED ORDER — TERBUTALINE SULFATE 1 MG/ML IJ SOLN: 0.2500 mg | Freq: Once | INTRAMUSCULAR | Status: AC | PRN

## 2013-07-29 MED ORDER — DIPHENHYDRAMINE HCL 50 MG/ML IJ SOLN: 12.5000 mg | INTRAMUSCULAR | Status: DC | PRN

## 2013-07-29 MED ORDER — PHENYLEPHRINE 40 MCG/ML (10ML) SYRINGE FOR IV PUSH (FOR BLOOD PRESSURE SUPPORT)
80.0000 ug | PREFILLED_SYRINGE | INTRAVENOUS | Status: DC | PRN
  Filled 2013-07-29: qty 2

## 2013-07-29 MED ORDER — LACTATED RINGERS IV SOLN: 500.0000 mL | INTRAVENOUS | Status: DC | PRN

## 2013-07-29 MED ORDER — OXYTOCIN 40 UNITS IN LACTATED RINGERS INFUSION - SIMPLE MED: 62.5000 mL/h | INTRAVENOUS | Status: DC

## 2013-07-29 MED ORDER — PHENYLEPHRINE 40 MCG/ML (10ML) SYRINGE FOR IV PUSH (FOR BLOOD PRESSURE SUPPORT)
80.0000 ug | PREFILLED_SYRINGE | INTRAVENOUS | Status: DC | PRN
  Filled 2013-07-29: qty 10
  Filled 2013-07-29: qty 2

## 2013-07-29 MED ORDER — IBUPROFEN 600 MG PO TABS: 600.0000 mg | ORAL_TABLET | Freq: Four times a day (QID) | ORAL | Status: DC | PRN

## 2013-07-29 MED ORDER — LIDOCAINE HCL (PF) 1 % IJ SOLN
30.0000 mL | INTRAMUSCULAR | Status: DC | PRN
  Filled 2013-07-29: qty 30

## 2013-07-29 MED ORDER — EPHEDRINE 5 MG/ML INJ
10.0000 mg | INTRAVENOUS | Status: DC | PRN
  Filled 2013-07-29: qty 2

## 2013-07-29 MED ORDER — OXYTOCIN BOLUS FROM INFUSION: 500.0000 mL | INTRAVENOUS | Status: DC

## 2013-07-29 MED ORDER — CITRIC ACID-SODIUM CITRATE 334-500 MG/5ML PO SOLN: 30.0000 mL | ORAL | Status: DC | PRN

## 2013-07-29 MED ORDER — EPHEDRINE 5 MG/ML INJ
10.0000 mg | INTRAVENOUS | Status: DC | PRN
  Filled 2013-07-29: qty 2
  Filled 2013-07-29: qty 4

## 2013-07-29 MED ORDER — LACTATED RINGERS IV SOLN
500.0000 mL | Freq: Once | INTRAVENOUS | Status: AC
  Administered 2013-07-30: 500 mL via INTRAVENOUS

## 2013-07-29 MED ORDER — ONDANSETRON HCL 4 MG/2ML IJ SOLN
4.0000 mg | Freq: Four times a day (QID) | INTRAMUSCULAR | Status: DC | PRN
  Administered 2013-07-30: 4 mg via INTRAVENOUS
  Filled 2013-07-29: qty 2

## 2013-07-29 MED ORDER — ACETAMINOPHEN 325 MG PO TABS: 650.0000 mg | ORAL_TABLET | ORAL | Status: DC | PRN

## 2013-07-29 MED ORDER — MISOPROSTOL 25 MCG QUARTER TABLET
25.0000 ug | ORAL_TABLET | ORAL | Status: DC | PRN
  Administered 2013-07-29 – 2013-07-30 (×3): 25 ug via VAGINAL
  Filled 2013-07-29: qty 1
  Filled 2013-07-29 (×3): qty 0.25

## 2013-07-29 NOTE — H&P (Signed)
Jennifer Rosales is a 28 y.o. female G1P0 at 40 wks and 2 days admitted for induction due to post dates. EDD is 07/27/2013 pregnancy has been uncomplicated.   U/s performed 07/03/2013 fetus was 6 lbs 3 oz at the 52%ile. Marland Kitchen. History OB History   Grav Para Term Preterm Abortions TAB SAB Ect Mult Living   1              Past Medical History  Diagnosis Date  . Obesity    Past Surgical History  Procedure Laterality Date  . Gallbladder surgery  2008  . Tonsillectomy    . Tonsillectomy and adenoidectomy    . Wisdom tooth extraction     Family History: family history includes Cancer in her brother; Crohn's disease in her paternal grandfather; Diabetes in her maternal grandmother and paternal grandmother; Hypertension in her father; Multiple sclerosis in her paternal grandmother. Social History:  reports that she has never smoked. She has never used smokeless tobacco. She reports that she does not drink alcohol or use illicit drugs.   Prenatal Transfer Tool  Maternal Diabetes: No Genetic Screening: Normal Maternal Ultrasounds/Referrals: Normal Fetal Ultrasounds or other Referrals:  None Maternal Substance Abuse:  No Significant Maternal Medications:  None Significant Maternal Lab Results:  Lab values include: Group B Strep negative Other Comments:  None  Review of Systems  All other systems reviewed and are negative.   Dilation: 1 Effacement (%): 70 Station: -2 Exam by:: GPayne,RN Height 5\' 4"  (1.626 m), weight 131.997 kg (291 lb). Maternal Exam:  Abdomen: Patient reports no abdominal tenderness. Estimated fetal weight is 7 lbs 8 oz .   Fetal presentation: vertex  Introitus: Normal vulva. Pelvis: adequate for delivery.      Fetal Exam Fetal Monitor Review: Mode: fetoscope.   Baseline rate: 145.   Fetal State Assessment: Category I - tracings are normal.     Physical Exam  Vitals reviewed. Constitutional: She is oriented to person, place, and time. She appears  well-developed and well-nourished.  HENT:  Head: Normocephalic.  Cardiovascular: Normal rate and regular rhythm.   Respiratory: Effort normal and breath sounds normal.  Musculoskeletal: Normal range of motion. She exhibits edema.  Neurological: She is alert and oriented to person, place, and time.  Skin: Skin is warm and dry.  Psychiatric: She has a normal mood and affect.    Prenatal labs: ABO, Rh: A/Positive/-- (09/05 0000) Antibody: Negative (09/05 0000) Rubella: Immune (09/05 0000) RPR: Nonreactive (09/05 0000)  HBsAg: Negative (09/05 0000)  HIV: Non-reactive (09/05 0000)  GBS: Negative (03/28 0000)   Assessment/Plan: 40 wks and 2 days EGA for induction due to post dates.. Pt request induction. She is aware of increased r/o cesarean section associated with induction and accepts this risk. Plan cytotec for cervical ripening Anticipate svd [facility] covering 7pm -7a on 07/29/2013   Dorien Chihuahuaara J. Symir Mah 07/29/2013, 9:46 PM

## 2013-07-30 ENCOUNTER — Inpatient Hospital Stay (HOSPITAL_COMMUNITY): Payer: Commercial Managed Care - PPO | Admitting: Anesthesiology

## 2013-07-30 ENCOUNTER — Encounter (HOSPITAL_COMMUNITY): Payer: Self-pay

## 2013-07-30 ENCOUNTER — Inpatient Hospital Stay (HOSPITAL_COMMUNITY): Admission: RE | Admit: 2013-07-30 | Payer: Commercial Managed Care - PPO | Source: Ambulatory Visit

## 2013-07-30 ENCOUNTER — Encounter (HOSPITAL_COMMUNITY): Payer: Commercial Managed Care - PPO | Admitting: Anesthesiology

## 2013-07-30 DIAGNOSIS — O480 Post-term pregnancy: Secondary | ICD-10-CM | POA: Diagnosis not present

## 2013-07-30 LAB — CBC
HCT: 31.7 % — ABNORMAL LOW (ref 36.0–46.0)
Hemoglobin: 10.4 g/dL — ABNORMAL LOW (ref 12.0–15.0)
MCH: 27.4 pg (ref 26.0–34.0)
MCHC: 32.8 g/dL (ref 30.0–36.0)
MCV: 83.4 fL (ref 78.0–100.0)
Platelets: 187 10*3/uL (ref 150–400)
RBC: 3.8 MIL/uL — ABNORMAL LOW (ref 3.87–5.11)
RDW: 14.6 % (ref 11.5–15.5)
WBC: 12.6 10*3/uL — ABNORMAL HIGH (ref 4.0–10.5)

## 2013-07-30 LAB — ABO/RH: ABO/RH(D): A POS

## 2013-07-30 LAB — RPR

## 2013-07-30 LAB — TYPE AND SCREEN
ABO/RH(D): A POS
Antibody Screen: NEGATIVE

## 2013-07-30 MED ORDER — FENTANYL 2.5 MCG/ML BUPIVACAINE 1/10 % EPIDURAL INFUSION (WH - ANES)
INTRAMUSCULAR | Status: DC | PRN
  Administered 2013-07-30: 14 mL/h via EPIDURAL

## 2013-07-30 MED ORDER — METHYLERGONOVINE MALEATE 0.2 MG/ML IJ SOLN
INTRAMUSCULAR | Status: AC
  Administered 2013-07-30: 0.2 mg via INTRAMUSCULAR
  Filled 2013-07-30: qty 1

## 2013-07-30 MED ORDER — OXYTOCIN 40 UNITS IN LACTATED RINGERS INFUSION - SIMPLE MED: 250.0000 mL/h | INTRAVENOUS | Status: DC

## 2013-07-30 MED ORDER — MISOPROSTOL 200 MCG PO TABS
ORAL_TABLET | ORAL | Status: AC
  Administered 2013-07-30: 1000 ug via RECTAL
  Filled 2013-07-30: qty 5

## 2013-07-30 MED ORDER — SIMETHICONE 80 MG PO CHEW: 80.0000 mg | CHEWABLE_TABLET | ORAL | Status: DC | PRN

## 2013-07-30 MED ORDER — BENZOCAINE-MENTHOL 20-0.5 % EX AERO: 1.0000 "application " | INHALATION_SPRAY | CUTANEOUS | Status: DC | PRN

## 2013-07-30 MED ORDER — WITCH HAZEL-GLYCERIN EX PADS: 1.0000 "application " | MEDICATED_PAD | CUTANEOUS | Status: DC | PRN

## 2013-07-30 MED ORDER — TETANUS-DIPHTH-ACELL PERTUSSIS 5-2.5-18.5 LF-MCG/0.5 IM SUSP: 0.5000 mL | Freq: Once | INTRAMUSCULAR | Status: DC

## 2013-07-30 MED ORDER — LIDOCAINE HCL (PF) 1 % IJ SOLN
INTRAMUSCULAR | Status: DC | PRN
  Administered 2013-07-30 (×2): 4 mL

## 2013-07-30 MED ORDER — ONDANSETRON HCL 4 MG PO TABS: 4.0000 mg | ORAL_TABLET | ORAL | Status: DC | PRN

## 2013-07-30 MED ORDER — SENNOSIDES-DOCUSATE SODIUM 8.6-50 MG PO TABS
2.0000 | ORAL_TABLET | ORAL | Status: DC
Start: 2013-07-31 — End: 2013-08-01
  Administered 2013-07-30 – 2013-07-31 (×2): 2 via ORAL
  Filled 2013-07-30 (×2): qty 2

## 2013-07-30 MED ORDER — METHYLERGONOVINE MALEATE 0.2 MG/ML IJ SOLN
0.2000 mg | Freq: Once | INTRAMUSCULAR | Status: AC
  Administered 2013-07-30: 0.2 mg via INTRAMUSCULAR

## 2013-07-30 MED ORDER — IBUPROFEN 600 MG PO TABS
600.0000 mg | ORAL_TABLET | Freq: Four times a day (QID) | ORAL | Status: DC
  Administered 2013-07-30 – 2013-08-01 (×6): 600 mg via ORAL
  Filled 2013-07-30 (×6): qty 1

## 2013-07-30 MED ORDER — PRENATAL MULTIVITAMIN CH
1.0000 | ORAL_TABLET | Freq: Every day | ORAL | Status: DC
  Administered 2013-07-31: 1 via ORAL
  Filled 2013-07-30: qty 1

## 2013-07-30 MED ORDER — OXYTOCIN 40 UNITS IN LACTATED RINGERS INFUSION - SIMPLE MED
1.0000 m[IU]/min | INTRAVENOUS | Status: DC
  Administered 2013-07-30: 2 m[IU]/min via INTRAVENOUS
  Filled 2013-07-30: qty 1000

## 2013-07-30 MED ORDER — FERROUS SULFATE 325 (65 FE) MG PO TABS
325.0000 mg | ORAL_TABLET | Freq: Two times a day (BID) | ORAL | Status: DC
  Administered 2013-07-31 – 2013-08-01 (×3): 325 mg via ORAL
  Filled 2013-07-30 (×4): qty 1

## 2013-07-30 MED ORDER — OXYCODONE-ACETAMINOPHEN 5-325 MG PO TABS: 1.0000 | ORAL_TABLET | ORAL | Status: DC | PRN

## 2013-07-30 MED ORDER — LANOLIN HYDROUS EX OINT: TOPICAL_OINTMENT | CUTANEOUS | Status: DC | PRN

## 2013-07-30 MED ORDER — DIBUCAINE 1 % RE OINT: 1.0000 "application " | TOPICAL_OINTMENT | RECTAL | Status: DC | PRN

## 2013-07-30 MED ORDER — TERBUTALINE SULFATE 1 MG/ML IJ SOLN: 0.2500 mg | Freq: Once | INTRAMUSCULAR | Status: DC | PRN

## 2013-07-30 MED ORDER — MISOPROSTOL 200 MCG PO TABS
800.0000 ug | ORAL_TABLET | Freq: Once | ORAL | Status: AC
Start: 2013-07-30 — End: 2013-07-30
  Administered 2013-07-30: 1000 ug via RECTAL

## 2013-07-30 MED ORDER — DIPHENHYDRAMINE HCL 25 MG PO CAPS: 25.0000 mg | ORAL_CAPSULE | Freq: Four times a day (QID) | ORAL | Status: DC | PRN

## 2013-07-30 MED ORDER — ZOLPIDEM TARTRATE 5 MG PO TABS: 5.0000 mg | ORAL_TABLET | Freq: Every evening | ORAL | Status: DC | PRN

## 2013-07-30 MED ORDER — FENTANYL 2.5 MCG/ML BUPIVACAINE 1/10 % EPIDURAL INFUSION (WH - ANES): 14.0000 mL/h | INTRAMUSCULAR | Status: DC | PRN

## 2013-07-30 MED ORDER — METHYLERGONOVINE MALEATE 0.2 MG PO TABS
0.2000 mg | ORAL_TABLET | ORAL | Status: AC
  Administered 2013-07-30 – 2013-07-31 (×5): 0.2 mg via ORAL
  Filled 2013-07-30 (×5): qty 1

## 2013-07-30 MED ORDER — ONDANSETRON HCL 4 MG/2ML IJ SOLN: 4.0000 mg | INTRAMUSCULAR | Status: DC | PRN

## 2013-07-30 NOTE — Anesthesia Preprocedure Evaluation (Signed)
Anesthesia Evaluation  Patient identified by MRN, date of birth, ID band Patient awake    Reviewed: Allergy & Precautions, H&P , NPO status , Patient's Chart, lab work & pertinent test results  Airway Mallampati: III TM Distance: >3 FB Neck ROM: Full    Dental no notable dental hx. (+) Teeth Intact   Pulmonary neg pulmonary ROS,  breath sounds clear to auscultation  Pulmonary exam normal       Cardiovascular negative cardio ROS  Rhythm:Regular Rate:Normal     Neuro/Psych negative neurological ROS  negative psych ROS   GI/Hepatic negative GI ROS, Neg liver ROS,   Endo/Other  Morbid obesity  Renal/GU negative Renal ROS  negative genitourinary   Musculoskeletal negative musculoskeletal ROS (+)   Abdominal (+) + obese,   Peds  Hematology  (+) anemia ,   Anesthesia Other Findings   Reproductive/Obstetrics (+) Pregnancy                           Anesthesia Physical Anesthesia Plan  ASA: III  Anesthesia Plan: Epidural   Post-op Pain Management:    Induction:   Airway Management Planned: Natural Airway  Additional Equipment:   Intra-op Plan:   Post-operative Plan:   Informed Consent: I have reviewed the patients History and Physical, chart, labs and discussed the procedure including the risks, benefits and alternatives for the proposed anesthesia with the patient or authorized representative who has indicated his/her understanding and acceptance.   Dental advisory given  Plan Discussed with: Anesthesiologist  Anesthesia Plan Comments:         Anesthesia Quick Evaluation

## 2013-07-30 NOTE — Anesthesia Procedure Notes (Signed)
Epidural Patient location during procedure: OB Start time: 07/30/2013 11:45 AM  Staffing Anesthesiologist: Tschantz, Tameko Halder A. Performed by: anesthesiologist   Preanesthetic Checklist Completed: patient identified, site marked, surgical consent, pre-op evaluation, timeout performed, IV checked, risks and benefits discussed and monitors and equipment checked  Epidural Patient position: sitting Prep: site prepped and draped and DuraPrep Patient monitoring: continuous pulse ox and blood pressure Approach: midline Location: L3-L4 Injection technique: LOR air  Needle:  Needle type: Tuohy  Needle gauge: 17 G Needle length: 9 cm and 9 Needle insertion depth: 8 cm Catheter type: closed end flexible Catheter size: 19 Gauge Catheter at skin depth: 13 cm Test dose: negative and Other  Assessment Events: blood not aspirated, injection not painful, no injection resistance, negative IV test and no paresthesia  Additional Notes Patient identified. Risks and benefits discussed including failed block, incomplete  Pain control, post dural puncture headache, nerve damage, paralysis, blood pressure Changes, nausea, vomiting, reactions to medications-both toxic and allergic and post Partum back pain. All questions were answered. Patient expressed understanding and wished to proceed. Sterile technique was used throughout procedure. Epidural site was Dressed with sterile barrier dressing. No paresthesias, signs of intravascular injection Or signs of intrathecal spread were encountered.  Patient was more comfortable after the epidural was dosed. Please see RN's note for documentation of vital signs and FHR which are stable.

## 2013-07-30 NOTE — Progress Notes (Signed)
Jennifer Rosales is a 28 y.o. G1P0 at 716w3d  admitted for induction of labor due to Post dates. Due date 07/27/2013.  Subjective:  Pt comfortable with epidural. + FM   Objective: BP 117/72  Pulse 86  Temp(Src) 97.9 F (36.6 C) (Oral)  Resp 18  Ht 5\' 4"  (1.626 m)  Wt 131.997 kg (291 lb)  BMI 49.93 kg/m2  SpO2 98%      FHT:  FHR: 140 bpm, variability: moderate,  accelerations:  Present,  decelerations:  Absent UC:   regular, every 2 minutes SVE:   2/70/-2  Labs: Lab Results  Component Value Date   WBC 10.9* 07/29/2013   HGB 11.5* 07/29/2013   HCT 34.9* 07/29/2013   MCV 83.3 07/29/2013   PLT 212 07/29/2013    Assessment / Plan: Induction of labor due to postterm,  progressing well on pitocin  Labor:  Continue pitocin Preeclampsia:  NA Fetal Wellbeing:  Category I Pain Control:  Epidural I/D:  n/a Anticipated MOD:  NSVD  Jennifer Rosales 07/30/2013, 1:29 PM

## 2013-07-31 LAB — CBC
HCT: 30.5 % — ABNORMAL LOW (ref 36.0–46.0)
Hemoglobin: 10 g/dL — ABNORMAL LOW (ref 12.0–15.0)
MCH: 27.2 pg (ref 26.0–34.0)
MCHC: 32.8 g/dL (ref 30.0–36.0)
MCV: 82.9 fL (ref 78.0–100.0)
Platelets: 187 10*3/uL (ref 150–400)
RBC: 3.68 MIL/uL — ABNORMAL LOW (ref 3.87–5.11)
RDW: 14.6 % (ref 11.5–15.5)
WBC: 13.2 10*3/uL — ABNORMAL HIGH (ref 4.0–10.5)

## 2013-07-31 NOTE — Lactation Note (Addendum)
This note was copied from the chart of [name]. Lactation Consultation Note Assisted 1st time mom in BF. Encouraged mom to sit upright in bed. Baby spitting coffee ground emesis at intervals. Abd. Slightly distended. Mom has pendulum breast. Elevated on dry wash cloth. Hand expression taught and demonstrated. Had pump given to "prime the line" and pull the nipple out more. There are not flat. Has raises round areolas, When compressed they sink inwards slightly. Mom has a lot of edema to LE. Baby is unable to obtain a deep latch. #24 and #20 NS given to mom. Baby latched well w/#20 NS. Hand pump given, noted colostrum. Encouraged football hold and demonstrated for deeper latch. Mom encouraged to feed baby 8-12 times/24 hours and with feeding cues. Specifics of an asymmetric latch shown. Reviewed Baby & Me book's Breastfeeding Basics. Mom encouraged to feed baby w/feeding cuesHand expression taught to Mom. Encouraged to call for assistance if needed and to verify proper latch. Patient Name: [name] ZOXWR'UToday's Date: 07/31/2013 Reason for consult: Initial assessment   Maternal Data    Feeding Feeding Type: Breast Fed Length of feed: 10 min  LATCH Score/Interventions Latch: Repeated attempts needed to sustain latch, nipple held in mouth throughout feeding, stimulation needed to elicit sucking reflex. Intervention(s): Skin to skin;Teach feeding cues;Waking techniques Intervention(s): Adjust position;Assist with latch;Breast massage;Breast compression  Audible Swallowing: A few with stimulation Intervention(s): Skin to skin;Hand expression Intervention(s): Skin to skin;Hand expression;Alternate breast massage  Type of Nipple: Everted at rest and after stimulation (compresses inwards d/t thick nipple tissue.)  Comfort (Breast/Nipple): Soft / non-tender     Hold (Positioning): Assistance needed to correctly position infant at breast and maintain latch.  LATCH Score:  7  Lactation Tools Discussed/Used Tools: Shells;Nipple Dorris CarnesShields;Pump Nipple shield size: 20;24 Shell Type: Inverted Breast pump type: Manual WIC Program: Yes   Consult Status Consult Status: Follow-up Date: 07/31/13 Follow-up type: In-patient    Charyl DancerLaura G Tatiyana Foucher 07/31/2013, 4:05 AM

## 2013-07-31 NOTE — Lactation Note (Signed)
This note was copied from the chart of Jennifer Rosales. Lactation Consultation Note  Patient Name: Jennifer Rosales WUJWJ'XToday's Date: 07/31/2013 Reason for consult: Follow-up assessment;Difficult latch (able to latch now with #20 NS).  LC arrived after a feeding and based on mom's report, the [facility] because she did not note any swallows but there was visible Rosales colostrum in shield after feeding (LC observed) and mom denies any nipple pain during 15 minute feeding.  Baby has output which exceeds minimum for this hour of life.  Mom requested a second #20 NS to have prior to discharge (LC provided) and reviewed need for additional pumping at least 4 times per day while using NS.  [facility] has a hand pump [facility].  LC encouraged continued pre-pumping with hand pump and alternating breasts, using NS if needed.  RN, betsy is caring for this dyad and reports attempts to latch earlier today w/o NS but baby needs NS due to continued flatness of mom's nipples.   Maternal Data    Feeding Feeding Type: Breast Fed (mom's report) Length of feed: 15 min  LATCH Score/Interventions Latch: Grasps breast easily, tongue down, lips flanged, rhythmical sucking. (mom's report)  Audible Swallowing: None (mom reports seeing colostrum in shield)  Type of Nipple: Flat  Comfort (Breast/Nipple): Soft / non-tender     Hold (Positioning): No assistance needed to correctly position infant at breast.  LATCH Score: 7 (LATCH score based on LC assessment after feeding and mom's report)  Lactation Tools Discussed/Used Nipple shield size: 20 WIC Program: Yes ([facility])   Consult Status Consult Status: Follow-up Date: 08/01/13 Follow-up type: In-patient    Zara ChessJoanne P Denissa Cozart 07/31/2013, 5:09 PM

## 2013-07-31 NOTE — Progress Notes (Signed)
Post Partum Day 1 s/p svd with pph 800 ml  Subjective: no complaints, up ad lib, voiding and tolerating PO  Objective: Blood pressure 97/58, pulse 96, temperature 97.8 F (36.6 C), temperature source Oral, resp. rate 18, height 5\' 4"  (1.626 m), weight 131.997 kg (291 lb), SpO2 98.00%, unknown if currently breastfeeding.  Physical Exam:  General: alert Lochia: appropriate Uterine Fundus: firm Incision: na DVT Evaluation: No evidence of DVT seen on physical exam.   Recent Labs  07/30/13 1835 07/31/13 0617  HGB 10.4* 10.0*  HCT 31.7* 30.5*    Assessment/Plan: Plan for discharge tomorrow and Breastfeeding   LOS: 2 days   Dorien Chihuahuaara J. Lary Eckardt 07/31/2013, 5:21 PM

## 2013-07-31 NOTE — Anesthesia Postprocedure Evaluation (Signed)
  Anesthesia Post-op Note  Patient: Jennifer SchleinLindsey J White  Procedure(s) Performed: * No procedures listed *  Patient Location: Mother/Baby  Anesthesia Type:Epidural  Level of Consciousness: awake, alert , oriented and patient cooperative  Airway and Oxygen Therapy: Patient Spontanous Breathing  Post-op Pain: mild  Post-op Assessment: Patient's Cardiovascular Status Stable, Respiratory Function Stable, No headache, No backache, No residual numbness and No residual motor weakness  Post-op Vital Signs: stable  Last Vitals:  Filed Vitals:   07/30/13 1930  BP: 120/77  Pulse: 99  Temp:   Resp: 18    Complications: No apparent anesthesia complications

## 2013-08-01 MED ORDER — FERROUS SULFATE 325 (65 FE) MG PO TABS: 325.0000 mg | ORAL_TABLET | Freq: Two times a day (BID) | ORAL | Status: DC

## 2013-08-01 NOTE — Progress Notes (Signed)
Post Partum Day 2 s/p svd with pph 800 ml  Subjective: Pt doing well this am, no acute complaints.  Tolerating gen diet.  Moderate lochia.  Ambulating and voiding freely.  +flatus and +BM.  No F/C/CP/SOB.    Objective: Blood pressure 115/68, pulse 87, temperature 98.2 F (36.8 C), temperature source Oral, resp. rate 18, height 5\' 4"  (1.626 m), weight 131.997 kg (291 lb), SpO2 98.00%, unknown if currently breastfeeding.  Physical Exam:  General: alert Lochia: appropriate Uterine Fundus: firm, below umbilicus Incision: na DVT Evaluation: No evidence of DVT seen on physical exam.  1+ pitting edema bilaterally   Recent Labs  07/30/13 1835 07/31/13 0617  HGB 10.4* 10.0*  HCT 31.7* 30.5*    Assessment/Plan:  28y G1P1001 s/p NSVD complicated by PPH, PPD#2 -Heme: lochia appriopriate, vitals stable.  Hgb stable as above -meeting postpartum milestones appropriately -pt plans on outpatient circ -Discharge home today with follow up in 6wks   LOS: 3 days   Sharon SellerJennifer M Skarlett Sedlacek 08/01/2013, 6:37 AM

## 2013-08-01 NOTE — Lactation Note (Signed)
This note was copied from the chart of Jennifer Rosales. Lactation Consultation Note Follow up consult:  Baby latch easily with #20NS.  Mother attempted w/o NS and he would not latch. Sucks and swallows observed.  Baby bf for 30 min.  Breastmilk viewed in NS and around mouth. [facility] Encourarged mother to post pump with DEBP for 15 min 4 times a day.  Monitor voids/stools.   Reviewed engorgement, milk storage and provided parents with a foley cup and volume guidelines and reviewed use.  Mother has an appt with [facility] on Monday 5/4 to follow up . Encouraged mother to call if she needes further assistance.    Patient Name: Jennifer Rosales JYNWG'NToday's Date: 08/01/2013 Reason for consult: Follow-up assessment   Maternal Data Has patient been taught Hand Expression?: Yes Does the patient have breastfeeding experience prior to this delivery?: No  Feeding Feeding Type: Breast Fed Length of feed: 25 min  LATCH Score/Interventions Latch: Grasps breast easily, tongue down, lips flanged, rhythmical sucking.  Audible Swallowing: Spontaneous and intermittent  Type of Nipple: Flat Intervention(s): Hand pump  Comfort (Breast/Nipple): Soft / non-tender     Hold (Positioning): No assistance needed to correctly position infant at breast.  LATCH Score: 9  Lactation Tools Discussed/Used Tools: Nipple Shields Nipple shield size: 20 WIC Program: Yes   Consult Status Consult Status: Complete    Hardie PulleyRuth Boschen Talesha Ellithorpe 08/01/2013, 9:04 AM

## 2013-08-01 NOTE — Discharge Summary (Signed)
Obstetric Discharge Summary Reason for Admission: induction of labor Date of Admission: 07/29/13 Date of Discharge: 08/01/13 Prenatal Procedures: none Intrapartum Procedures: spontaneous vaginal delivery Postpartum Procedures: none Complications-Operative and Postpartum: 2nd degree perineal laceration and hemorrhage Hemoglobin  Date Value Ref Range Status  07/31/2013 10.0* 12.0 - 15.0 g/dL Final     HCT  Date Value Ref Range Status  07/31/2013 30.5* 36.0 - 46.0 % Final    Physical Exam:  General: alert Lochia: appropriate Uterine Fundus: firm Incision: N/A DVT Evaluation: No evidence of DVT seen on physical exam.  Hospital Course: 28 y.o. female G1P0 at 40 wks and 2 days admitted for induction due to post dates. EDD is 07/27/2013 pregnancy has been uncomplicated. The patient received an epidural for pain management.  She was induced with cytotec followed by Pitocin and AROM of clear fluid.  She progressed to complete, NSVD by Dr. Christophe Louis with repair of 2nd degree laceration.  Delivery complicated by PPH, received Methergine and cytotec with improvement in her bleeding.  Methergine was continued x 24hr. For further information regarding the delivery, please see the delivery summary.  Hgb remained stable and the patient met postpartum milestones appropriately, she was discharged home in stable condition on PPD#2.   Discharge Diagnoses: Term Pregnancy-delivered  Discharge Information: Date: 08/01/2013 Activity: pelvic rest Diet: routine Medications: Ibuprofen, Colace and Iron Condition: stable Instructions: refer to practice specific booklet Discharge to: home Follow-up Information   Follow up with Catha Brow., MD In 6 weeks.   Specialty:  Obstetrics and Gynecology   Contact information:   [address] [place] [address] [phone]       Newborn Data: Live born female  Birth Weight: 7 lb 11.8 oz (3510 g) APGAR: 9, 9  Home with mother.  Annalee Genta 08/01/2013, 6:41 AM

## 2013-10-31 ENCOUNTER — Other Ambulatory Visit (HOSPITAL_COMMUNITY)
Admission: RE | Admit: 2013-10-31 | Discharge: 2013-10-31 | Disposition: A | Discharge status: Home / Self Care | Payer: Medicaid Other | Source: Ambulatory Visit | Attending: Obstetrics and Gynecology | Admitting: Obstetrics and Gynecology

## 2013-10-31 ENCOUNTER — Other Ambulatory Visit: Payer: Self-pay | Admitting: Obstetrics and Gynecology

## 2013-10-31 DIAGNOSIS — Z01419 Encounter for gynecological examination (general) (routine) without abnormal findings: Secondary | ICD-10-CM | POA: Diagnosis present

## 2013-11-04 LAB — CYTOLOGY - PAP

## 2014-02-02 ENCOUNTER — Encounter (HOSPITAL_COMMUNITY): Payer: Self-pay

## 2015-06-09 DIAGNOSIS — Z9884 Bariatric surgery status: Secondary | ICD-10-CM | POA: Insufficient documentation

## 2015-11-03 DIAGNOSIS — E559 Vitamin D deficiency, unspecified: Secondary | ICD-10-CM | POA: Insufficient documentation

## 2016-03-30 ENCOUNTER — Other Ambulatory Visit (HOSPITAL_COMMUNITY)
Admission: RE | Admit: 2016-03-30 | Discharge: 2016-03-30 | Disposition: A | Discharge status: Home / Self Care | Payer: Medicaid Other | Source: Ambulatory Visit | Attending: Obstetrics and Gynecology | Admitting: Obstetrics and Gynecology

## 2016-03-30 ENCOUNTER — Other Ambulatory Visit: Payer: Self-pay | Admitting: Obstetrics and Gynecology

## 2016-03-30 ENCOUNTER — Other Ambulatory Visit (HOSPITAL_COMMUNITY): Payer: Self-pay | Admitting: Obstetrics and Gynecology

## 2016-03-30 DIAGNOSIS — Z01419 Encounter for gynecological examination (general) (routine) without abnormal findings: Secondary | ICD-10-CM | POA: Insufficient documentation

## 2016-03-30 DIAGNOSIS — IMO0002 Reserved for concepts with insufficient information to code with codable children: Secondary | ICD-10-CM

## 2016-03-30 DIAGNOSIS — Z113 Encounter for screening for infections with a predominantly sexual mode of transmission: Secondary | ICD-10-CM | POA: Diagnosis present

## 2016-03-31 ENCOUNTER — Ambulatory Visit (HOSPITAL_COMMUNITY)
Admission: RE | Admit: 2016-03-31 | Discharge: 2016-03-31 | Disposition: A | Discharge status: Home / Self Care | Payer: Medicaid Other | Source: Ambulatory Visit | Attending: Obstetrics and Gynecology | Admitting: Obstetrics and Gynecology

## 2016-03-31 DIAGNOSIS — IMO0002 Reserved for concepts with insufficient information to code with codable children: Secondary | ICD-10-CM

## 2016-03-31 DIAGNOSIS — Z3A01 Less than 8 weeks gestation of pregnancy: Secondary | ICD-10-CM | POA: Insufficient documentation

## 2016-03-31 DIAGNOSIS — Z3689 Encounter for other specified antenatal screening: Secondary | ICD-10-CM | POA: Diagnosis not present

## 2016-03-31 DIAGNOSIS — O208 Other hemorrhage in early pregnancy: Secondary | ICD-10-CM | POA: Diagnosis not present

## 2016-04-03 NOTE — L&D Delivery Note (Signed)
Delivery Note At 2:00 AM a viable female was delivered via Vaginal, Spontaneous Delivery (Presentation: Direct OA  ).  APGAR: 9, 9; weight 6 lb 8.6 oz (2965 g).   Placenta status: intact ,spontaneous .  Cord:  with the following complications:Tight nuchal cord, surgically reduced.  Cord pH: n/a  Anesthesia:  Epidural Episiotomy: None Lacerations: 2nd degree Suture Repair: 2.0 3.0 chromic Est. Blood Loss (mL): 250  Mom to postpartum.  Baby to Couplet care / Skin to Skin.  Bernece Gall 11/12/2016, 8:01 AM

## 2016-04-04 LAB — CYTOLOGY - PAP
Chlamydia: NEGATIVE
Diagnosis: NEGATIVE
Neisseria Gonorrhea: NEGATIVE

## 2016-05-10 ENCOUNTER — Other Ambulatory Visit (HOSPITAL_COMMUNITY): Payer: Self-pay | Admitting: Obstetrics and Gynecology

## 2016-05-10 DIAGNOSIS — Z3689 Encounter for other specified antenatal screening: Secondary | ICD-10-CM

## 2016-05-10 DIAGNOSIS — O280 Abnormal hematological finding on antenatal screening of mother: Secondary | ICD-10-CM

## 2016-05-10 DIAGNOSIS — Z3A19 19 weeks gestation of pregnancy: Secondary | ICD-10-CM

## 2016-05-17 ENCOUNTER — Encounter (HOSPITAL_COMMUNITY): Payer: Self-pay | Admitting: *Deleted

## 2016-05-18 ENCOUNTER — Other Ambulatory Visit (HOSPITAL_COMMUNITY): Payer: Self-pay | Admitting: Obstetrics and Gynecology

## 2016-05-18 ENCOUNTER — Encounter (HOSPITAL_COMMUNITY): Payer: Self-pay

## 2016-05-18 ENCOUNTER — Ambulatory Visit (HOSPITAL_COMMUNITY)
Admission: RE | Admit: 2016-05-18 | Discharge: 2016-05-18 | Disposition: A | Discharge status: Home / Self Care | Payer: Medicaid Other | Source: Ambulatory Visit | Attending: Obstetrics and Gynecology | Admitting: Obstetrics and Gynecology

## 2016-05-18 ENCOUNTER — Ambulatory Visit (HOSPITAL_COMMUNITY): Admission: RE | Admit: 2016-05-18 | Payer: Medicaid Other | Source: Ambulatory Visit

## 2016-05-18 DIAGNOSIS — Z3682 Encounter for antenatal screening for nuchal translucency: Secondary | ICD-10-CM

## 2016-05-18 DIAGNOSIS — Z3A19 19 weeks gestation of pregnancy: Secondary | ICD-10-CM

## 2016-05-18 DIAGNOSIS — Z3A13 13 weeks gestation of pregnancy: Secondary | ICD-10-CM

## 2016-05-18 DIAGNOSIS — O99211 Obesity complicating pregnancy, first trimester: Secondary | ICD-10-CM | POA: Diagnosis not present

## 2016-05-18 DIAGNOSIS — Z3689 Encounter for other specified antenatal screening: Secondary | ICD-10-CM

## 2016-05-18 DIAGNOSIS — O280 Abnormal hematological finding on antenatal screening of mother: Secondary | ICD-10-CM

## 2016-05-19 ENCOUNTER — Other Ambulatory Visit (HOSPITAL_COMMUNITY): Payer: Self-pay

## 2016-05-22 ENCOUNTER — Other Ambulatory Visit: Payer: Self-pay

## 2016-05-26 ENCOUNTER — Other Ambulatory Visit: Payer: Self-pay

## 2016-05-31 ENCOUNTER — Other Ambulatory Visit (HOSPITAL_COMMUNITY): Payer: Self-pay | Admitting: Obstetrics and Gynecology

## 2016-05-31 DIAGNOSIS — Z3689 Encounter for other specified antenatal screening: Secondary | ICD-10-CM

## 2016-05-31 DIAGNOSIS — Z3A19 19 weeks gestation of pregnancy: Secondary | ICD-10-CM

## 2016-06-26 ENCOUNTER — Other Ambulatory Visit (HOSPITAL_COMMUNITY): Payer: Commercial Managed Care - PPO

## 2016-06-26 ENCOUNTER — Other Ambulatory Visit (HOSPITAL_COMMUNITY): Payer: Self-pay | Admitting: Obstetrics and Gynecology

## 2016-06-26 ENCOUNTER — Ambulatory Visit (HOSPITAL_COMMUNITY)
Admission: RE | Admit: 2016-06-26 | Discharge: 2016-06-26 | Disposition: A | Discharge status: Home / Self Care | Payer: Medicaid Other | Source: Ambulatory Visit | Attending: Obstetrics and Gynecology | Admitting: Obstetrics and Gynecology

## 2016-06-26 DIAGNOSIS — Z3689 Encounter for other specified antenatal screening: Secondary | ICD-10-CM

## 2016-06-26 DIAGNOSIS — Z3A19 19 weeks gestation of pregnancy: Secondary | ICD-10-CM

## 2016-06-26 DIAGNOSIS — O99212 Obesity complicating pregnancy, second trimester: Secondary | ICD-10-CM | POA: Diagnosis not present

## 2016-10-08 ENCOUNTER — Encounter (HOSPITAL_COMMUNITY): Payer: Self-pay | Admitting: *Deleted

## 2016-10-08 ENCOUNTER — Inpatient Hospital Stay (HOSPITAL_COMMUNITY)
Admission: AD | Admit: 2016-10-08 | Discharge: 2016-10-08 | Disposition: A | Discharge status: Home / Self Care | Payer: Medicaid Other | Source: Ambulatory Visit | Attending: Obstetrics and Gynecology | Admitting: Obstetrics and Gynecology

## 2016-10-08 DIAGNOSIS — O26899 Other specified pregnancy related conditions, unspecified trimester: Secondary | ICD-10-CM | POA: Diagnosis not present

## 2016-10-08 DIAGNOSIS — O26893 Other specified pregnancy related conditions, third trimester: Secondary | ICD-10-CM | POA: Insufficient documentation

## 2016-10-08 DIAGNOSIS — O368130 Decreased fetal movements, third trimester, not applicable or unspecified: Secondary | ICD-10-CM | POA: Diagnosis not present

## 2016-10-08 DIAGNOSIS — Z3A34 34 weeks gestation of pregnancy: Secondary | ICD-10-CM | POA: Diagnosis not present

## 2016-10-08 DIAGNOSIS — R109 Unspecified abdominal pain: Secondary | ICD-10-CM

## 2016-10-08 LAB — URINALYSIS, ROUTINE W REFLEX MICROSCOPIC
Bilirubin Urine: NEGATIVE
Glucose, UA: NEGATIVE mg/dL
Hgb urine dipstick: NEGATIVE
Ketones, ur: NEGATIVE mg/dL
Nitrite: NEGATIVE
Protein, ur: NEGATIVE mg/dL
Specific Gravity, Urine: 1.003 — ABNORMAL LOW (ref 1.005–1.030)
pH: 7 (ref 5.0–8.0)
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
## 2016-10-08 NOTE — Discharge Instructions (Signed)

## 2016-10-26 ENCOUNTER — Encounter (HOSPITAL_COMMUNITY): Payer: Self-pay

## 2016-10-26 ENCOUNTER — Inpatient Hospital Stay (HOSPITAL_COMMUNITY)
Admission: AD | Admit: 2016-10-26 | Discharge: 2016-10-26 | Disposition: A | Discharge status: Home / Self Care | Payer: Medicaid Other | Source: Ambulatory Visit | Attending: Obstetrics and Gynecology | Admitting: Obstetrics and Gynecology

## 2016-10-26 DIAGNOSIS — O479 False labor, unspecified: Secondary | ICD-10-CM

## 2016-10-26 DIAGNOSIS — O4700 False labor before 37 completed weeks of gestation, unspecified trimester: Secondary | ICD-10-CM

## 2016-10-26 DIAGNOSIS — Z3A37 37 weeks gestation of pregnancy: Secondary | ICD-10-CM | POA: Insufficient documentation

## 2016-10-26 NOTE — MAU Note (Signed)
I have communicated with Dr. Richardson Doppole and reviewed vital signs:  Vitals:   10/26/16 2020 10/26/16 2107  BP: 131/80 115/62  Pulse: 76 73  Resp: 19 16  Temp: 98.3 F (36.8 C)     Vaginal exam:  Dilation: 1.5 Effacement (%): 30 Cervical Position: Posterior Station: Ballotable Exam by:: Ginnie Smartachel Sita Mangen RN,   Also reviewed contraction pattern and that non-stress test is reactive.  It has been documented that patient is contracting irregularly with no cervical change since her office visit not indicating active labor.  Patient denies any other complaints.  Based on this report provider has given order for discharge.  A discharge order and diagnosis entered by a provider.   Labor discharge instructions reviewed with patient.

## 2016-10-26 NOTE — MAU Note (Signed)
Pt states she lost her mucous plug. Had a small amount of pink blood in it. States she has lower abdominal cramping. Pt reports fetal movement, but last felt movement around 6pm. States she was 2.5cm on last exam.

## 2016-10-26 NOTE — Discharge Instructions (Signed)

## 2016-11-11 ENCOUNTER — Inpatient Hospital Stay (HOSPITAL_COMMUNITY): Payer: Medicaid Other | Admitting: Anesthesiology

## 2016-11-11 ENCOUNTER — Inpatient Hospital Stay (HOSPITAL_COMMUNITY)
Admission: AD | Admit: 2016-11-11 | Discharge: 2016-11-13 | DRG: 775 | Disposition: A | Discharge status: Home / Self Care | Payer: Medicaid Other | Source: Ambulatory Visit | Attending: Obstetrics and Gynecology | Admitting: Obstetrics and Gynecology

## 2016-11-11 ENCOUNTER — Encounter (HOSPITAL_COMMUNITY): Payer: Self-pay

## 2016-11-11 DIAGNOSIS — O9902 Anemia complicating childbirth: Secondary | ICD-10-CM | POA: Diagnosis present

## 2016-11-11 DIAGNOSIS — D649 Anemia, unspecified: Secondary | ICD-10-CM | POA: Diagnosis present

## 2016-11-11 DIAGNOSIS — O99214 Obesity complicating childbirth: Secondary | ICD-10-CM | POA: Diagnosis present

## 2016-11-11 DIAGNOSIS — O4202 Full-term premature rupture of membranes, onset of labor within 24 hours of rupture: Secondary | ICD-10-CM | POA: Diagnosis present

## 2016-11-11 DIAGNOSIS — O4290 Premature rupture of membranes, unspecified as to length of time between rupture and onset of labor, unspecified weeks of gestation: Secondary | ICD-10-CM | POA: Diagnosis present

## 2016-11-11 DIAGNOSIS — Z6837 Body mass index (BMI) 37.0-37.9, adult: Secondary | ICD-10-CM

## 2016-11-11 DIAGNOSIS — Z3A39 39 weeks gestation of pregnancy: Secondary | ICD-10-CM

## 2016-11-11 LAB — COMPREHENSIVE METABOLIC PANEL
ALT: 9 U/L — ABNORMAL LOW (ref 14–54)
AST: 15 U/L (ref 15–41)
Albumin: 2.8 g/dL — ABNORMAL LOW (ref 3.5–5.0)
Alkaline Phosphatase: 119 U/L (ref 38–126)
Anion gap: 9 (ref 5–15)
BUN: 6 mg/dL (ref 6–20)
CO2: 22 mmol/L (ref 22–32)
Calcium: 8.7 mg/dL — ABNORMAL LOW (ref 8.9–10.3)
Chloride: 105 mmol/L (ref 101–111)
Creatinine, Ser: 0.52 mg/dL (ref 0.44–1.00)
GFR calc Af Amer: >60 mL/min (ref >60)
GFR calc non Af Amer: >60 mL/min (ref >60)
Glucose, Bld: 75 mg/dL (ref 65–99)
Potassium: 3.9 mmol/L (ref 3.5–5.1)
Sodium: 136 mmol/L (ref 135–145)
Total Bilirubin: 0.7 mg/dL (ref 0.3–1.2)
Total Protein: 6 g/dL — ABNORMAL LOW (ref 6.5–8.1)

## 2016-11-11 LAB — CBC
HCT: 32.3 % — ABNORMAL LOW (ref 36.0–46.0)
Hemoglobin: 10.3 g/dL — ABNORMAL LOW (ref 12.0–15.0)
MCH: 24.2 pg — ABNORMAL LOW (ref 26.0–34.0)
MCHC: 31.9 g/dL (ref 30.0–36.0)
MCV: 75.8 fL — ABNORMAL LOW (ref 78.0–100.0)
Platelets: 207 10*3/uL (ref 150–400)
RBC: 4.26 MIL/uL (ref 3.87–5.11)
RDW: 15.1 % (ref 11.5–15.5)
WBC: 11.4 10*3/uL — ABNORMAL HIGH (ref 4.0–10.5)

## 2016-11-11 LAB — PROTEIN / CREATININE RATIO, URINE
Creatinine, Urine: 127 mg/dL
Protein Creatinine Ratio: 0.09 mg/mg{Cre} (ref 0.00–0.15)
Total Protein, Urine: 11 mg/dL

## 2016-11-11 LAB — URINALYSIS, ROUTINE W REFLEX MICROSCOPIC
Bilirubin Urine: NEGATIVE
Glucose, UA: NEGATIVE mg/dL
Ketones, ur: NEGATIVE mg/dL
Nitrite: NEGATIVE
Protein, ur: NEGATIVE mg/dL
Specific Gravity, Urine: 1.014 (ref 1.005–1.030)
pH: 6 (ref 5.0–8.0)

## 2016-11-11 LAB — POCT FERN TEST: POCT Fern Test: POSITIVE

## 2016-11-11 LAB — TYPE AND SCREEN
ABO/RH(D): A POS
Antibody Screen: NEGATIVE

## 2016-11-11 MED ORDER — EPHEDRINE 5 MG/ML INJ
10.0000 mg | INTRAVENOUS | Status: DC | PRN
  Filled 2016-11-11: qty 2

## 2016-11-11 MED ORDER — SOD CITRATE-CITRIC ACID 500-334 MG/5ML PO SOLN: 30.0000 mL | ORAL | Status: DC | PRN

## 2016-11-11 MED ORDER — ACETAMINOPHEN 325 MG PO TABS: 650.0000 mg | ORAL_TABLET | ORAL | Status: DC | PRN

## 2016-11-11 MED ORDER — TERBUTALINE SULFATE 1 MG/ML IJ SOLN
0.2500 mg | Freq: Once | INTRAMUSCULAR | Status: DC | PRN
  Filled 2016-11-11: qty 1

## 2016-11-11 MED ORDER — PHENYLEPHRINE 40 MCG/ML (10ML) SYRINGE FOR IV PUSH (FOR BLOOD PRESSURE SUPPORT)
80.0000 ug | PREFILLED_SYRINGE | INTRAVENOUS | Status: DC | PRN
  Filled 2016-11-11: qty 5

## 2016-11-11 MED ORDER — LIDOCAINE HCL (PF) 1 % IJ SOLN
30.0000 mL | INTRAMUSCULAR | Status: DC | PRN
  Filled 2016-11-11: qty 30

## 2016-11-11 MED ORDER — OXYCODONE-ACETAMINOPHEN 5-325 MG PO TABS: 2.0000 | ORAL_TABLET | ORAL | Status: DC | PRN

## 2016-11-11 MED ORDER — LACTATED RINGERS IV SOLN: 500.0000 mL | INTRAVENOUS | Status: DC | PRN

## 2016-11-11 MED ORDER — DIPHENHYDRAMINE HCL 50 MG/ML IJ SOLN: 12.5000 mg | INTRAMUSCULAR | Status: DC | PRN

## 2016-11-11 MED ORDER — OXYTOCIN 40 UNITS IN LACTATED RINGERS INFUSION - SIMPLE MED
1.0000 m[IU]/min | INTRAVENOUS | Status: DC
  Administered 2016-11-11: 2 m[IU]/min via INTRAVENOUS
  Filled 2016-11-11: qty 1000

## 2016-11-11 MED ORDER — LIDOCAINE HCL (PF) 1 % IJ SOLN
INTRAMUSCULAR | Status: DC | PRN
  Administered 2016-11-11 (×2): 4 mL via EPIDURAL

## 2016-11-11 MED ORDER — HYDROXYZINE HCL 50 MG PO TABS
50.0000 mg | ORAL_TABLET | Freq: Four times a day (QID) | ORAL | Status: DC | PRN
Start: 2016-11-11 — End: 2016-11-12
  Filled 2016-11-11: qty 1

## 2016-11-11 MED ORDER — ONDANSETRON HCL 4 MG/2ML IJ SOLN
4.0000 mg | Freq: Four times a day (QID) | INTRAMUSCULAR | Status: DC | PRN
  Administered 2016-11-12: 4 mg via INTRAVENOUS
  Filled 2016-11-11: qty 2

## 2016-11-11 MED ORDER — LACTATED RINGERS IV SOLN
INTRAVENOUS | Status: DC
  Administered 2016-11-11: 22:00:00 via INTRAVENOUS

## 2016-11-11 MED ORDER — OXYTOCIN BOLUS FROM INFUSION
500.0000 mL | Freq: Once | INTRAVENOUS | Status: AC
  Administered 2016-11-12: 500 mL via INTRAVENOUS

## 2016-11-11 MED ORDER — LACTATED RINGERS IV SOLN
500.0000 mL | Freq: Once | INTRAVENOUS | Status: AC
  Administered 2016-11-11: 500 mL via INTRAVENOUS

## 2016-11-11 MED ORDER — OXYTOCIN 40 UNITS IN LACTATED RINGERS INFUSION - SIMPLE MED
2.5000 [IU]/h | INTRAVENOUS | Status: DC
  Administered 2016-11-12: 2.5 [IU]/h via INTRAVENOUS

## 2016-11-11 MED ORDER — ZOLPIDEM TARTRATE 5 MG PO TABS: 5.0000 mg | ORAL_TABLET | Freq: Every evening | ORAL | Status: DC | PRN

## 2016-11-11 MED ORDER — PHENYLEPHRINE 40 MCG/ML (10ML) SYRINGE FOR IV PUSH (FOR BLOOD PRESSURE SUPPORT)
80.0000 ug | PREFILLED_SYRINGE | INTRAVENOUS | Status: DC | PRN
  Filled 2016-11-11: qty 5
  Filled 2016-11-11: qty 10

## 2016-11-11 MED ORDER — OXYCODONE-ACETAMINOPHEN 5-325 MG PO TABS: 1.0000 | ORAL_TABLET | ORAL | Status: DC | PRN

## 2016-11-11 MED ORDER — FENTANYL 2.5 MCG/ML BUPIVACAINE 1/10 % EPIDURAL INFUSION (WH - ANES)
14.0000 mL/h | INTRAMUSCULAR | Status: DC | PRN
  Administered 2016-11-11 (×2): 14 mL/h via EPIDURAL
  Filled 2016-11-11: qty 100

## 2016-11-11 MED ORDER — BUTORPHANOL TARTRATE 1 MG/ML IJ SOLN: 1.0000 mg | INTRAMUSCULAR | Status: DC | PRN

## 2016-11-11 NOTE — MAU Note (Signed)
Was checked Thurs and told had not ruptured membranes. Have continued to leak fld off and on. Soaked panties 4 times today. Having contractions. Was dilated 1.5Thurs. Tonight saw some black spots. No b/p problems

## 2016-11-11 NOTE — Anesthesia Pain Management Evaluation Note (Signed)
  CRNA Pain Management Visit Note  Patient: Jennifer OxfordLindsey J Rosales, 31 y.o., female  "Hello I am a member of the anesthesia team at [facility]. We have an anesthesia team available at all times to provide care throughout the hospital, including epidural management and anesthesia for C-section. I don't know your plan for the delivery whether it a natural birth, water birth, IV sedation, nitrous supplementation, doula or epidural, but we want to meet your pain goals."   1.Was your pain managed to your expectations on prior hospitalizations?   Yes   2.What is your expectation for pain management during this hospitalization?     Epidural  3.How can we help you reach that goal? epidural  Record the patient's initial score and the patient's pain goal.   Pain: 6  Pain Goal: 8 [facility] wants you to be able to say your pain was always managed very well.  Brynja Marker 11/11/2016

## 2016-11-11 NOTE — H&P (Signed)
Jennifer OxfordLindsey J Rosales is a 31 y.o. female G2 P1001 @ 39 0/7 weeks presenting for ruptured membranes.  Pt thought she was ruptured 3-4 days ago but w/u at routine ob visit was negative.  Pt felt some leakage yesterday but felt a significant leakage this morning at 0730.  She continued to leak, changed pads multiple times.  Has noted some spotting and some contractions. [facility]).   Pt reports a fairly quick rapid labor and delivery with her first baby 3 years ago.  OB History    Gravida Para Term Preterm AB Living   2 1 1  0 0 1   SAB TAB Ectopic Multiple Live Births   0 0 0 0 1     Past Medical History:  Diagnosis Date  . Obesity    Past Surgical History:  Procedure Laterality Date  . GALLBLADDER SURGERY  2008  . LAPAROSCOPIC GASTRIC SLEEVE RESECTION    . TONSILLECTOMY    . TONSILLECTOMY AND ADENOIDECTOMY    . WISDOM TOOTH EXTRACTION     Family History: family history includes Cancer in her brother; Crohn's disease in her paternal grandfather; Diabetes in her maternal grandmother and paternal grandmother; Hypertension in her father; Multiple sclerosis in her paternal grandmother. Social History:  reports that she has never smoked. She has never used smokeless tobacco. She reports that she does not drink alcohol or use drugs.     Maternal Diabetes: No Genetic Screening: Normal Maternal Ultrasounds/Referrals: Normal Fetal Ultrasounds or other Referrals:  None Maternal Substance Abuse:  No Significant Maternal Medications:  None Significant Maternal Lab Results:  Lab values include: Group B Strep negative Other Comments:  None  Review of Systems  Respiratory: Positive for cough.   Gastrointestinal: Positive for heartburn.   Maternal Medical History:  Reason for admission: Rupture of membranes.   Contractions: Onset was 1 week or more ago.   Frequency: irregular.   Perceived severity is mild.    Fetal activity: Perceived fetal activity  is normal.    Prenatal complications: no prenatal complications   Dilation: 3 Effacement (%): 70 Station: -3 Exam by:: Dr.Kaelee Pfeffer Blood pressure 119/83, pulse 80, temperature 98.3 F (36.8 C), temperature source Oral, resp. rate 16, height 5\' 6"  (1.676 m), weight 104.3 kg (230 lb), last menstrual period 01/04/2016, unknown if currently breastfeeding. Maternal Exam:  Uterine Assessment: Contraction frequency is rare and irregular.   Abdomen: Patient reports no abdominal tenderness. Estimated fetal weight is 7 pounds.  Difficult to assess due to large pannust.   Fetal presentation: vertex  Introitus: Normal vulva. Amniotic fluid character: clear.  Pelvis: adequate for delivery.   Cervix: Cervix evaluated by digital exam.     Fetal Exam Fetal Monitor Review: Mode: fetoscope.   Variability: moderate (6-25 bpm).   Pattern: accelerations present and no decelerations.    Fetal State Assessment: Category I - tracings are normal.     Physical Exam  Constitutional: She is oriented to person, place, and time. She appears well-developed and well-nourished. No distress.  HENT:  Head: Normocephalic and atraumatic.  Eyes: EOM are normal.  Neck: Normal range of motion.  Respiratory: Effort normal and breath sounds normal. No respiratory distress.  GI: There is no tenderness.  Musculoskeletal: Normal range of motion. She exhibits edema.  Neurological: She is alert and oriented to person, place, and time.  Skin: Skin is warm and dry.  Psychiatric: She has a normal mood and affect.    Prenatal labs: ABO,  Rh:   Antibody:   Rubella:   RPR:    HBsAg:    HIV:    GBS:     Assessment/Plan: IUP @  39 weeks PROM x 15 hours.  No signs of chorioamnionitis. GBS negative  Admit for active management. Start Pitocin per protocol. Epidural at pt request. Reviewed risk of chorioamnionitis.  Monitor closely for fever.   Geryl Rankins 11/11/2016, 10:17 PM

## 2016-11-11 NOTE — Anesthesia Procedure Notes (Signed)
Epidural Patient location during procedure: OB Start time: 11/11/2016 11:31 PM  Staffing Anesthesiologist: Mal AmabileFOSTER, Cartier Mapel Performed: anesthesiologist   Preanesthetic Checklist Completed: patient identified, site marked, surgical consent, pre-op evaluation, timeout performed, IV checked, risks and benefits discussed and monitors and equipment checked  Epidural Patient position: sitting Prep: site prepped and draped and DuraPrep Patient monitoring: continuous pulse ox and blood pressure Approach: midline Location: L3-L4 Injection technique: LOR air  Needle:  Needle type: Tuohy  Needle gauge: 17 G Needle length: 9 cm and 9 Needle insertion depth: 7 cm Catheter type: closed end flexible Catheter size: 19 Gauge Catheter at skin depth: 12 cm Test dose: negative and Other  Assessment Events: blood not aspirated, injection not painful, no injection resistance, negative IV test and no paresthesia  Additional Notes Patient identified. Risks and benefits discussed including failed block, incomplete  Pain control, post dural puncture headache, nerve damage, paralysis, blood pressure Changes, nausea, vomiting, reactions to medications-both toxic and allergic and post Partum back pain. All questions were answered. Patient expressed understanding and wished to proceed. Sterile technique was used throughout procedure. Epidural site was Dressed with sterile barrier dressing. No paresthesias, signs of intravascular injection Or signs of intrathecal spread were encountered.  Patient was more comfortable after the epidural was dosed. Please see RN's note for documentation of vital signs and FHR which are stable.

## 2016-11-11 NOTE — Anesthesia Preprocedure Evaluation (Signed)
Anesthesia Evaluation  Patient identified by MRN, date of birth, ID band Patient awake    Reviewed: Allergy & Precautions, H&P , NPO status , Patient's Chart, lab work & pertinent test results  Airway Mallampati: III  TM Distance: >3 FB Neck ROM: Full    Dental no notable dental hx. (+) Teeth Intact   Pulmonary neg pulmonary ROS,    Pulmonary exam normal breath sounds clear to auscultation       Cardiovascular negative cardio ROS Normal cardiovascular exam Rhythm:Regular Rate:Normal     Neuro/Psych negative neurological ROS  negative psych ROS   GI/Hepatic negative GI ROS, Neg liver ROS,   Endo/Other  Morbid obesity  Renal/GU negative Renal ROS  negative genitourinary   Musculoskeletal negative musculoskeletal ROS (+)   Abdominal (+) + obese,   Peds  Hematology  (+) anemia ,   Anesthesia Other Findings   Reproductive/Obstetrics (+) Pregnancy                             Anesthesia Physical  Anesthesia Plan  ASA: III  Anesthesia Plan: Epidural   Post-op Pain Management:    Induction:   PONV Risk Score and Plan:   Airway Management Planned: Natural Airway  Additional Equipment:   Intra-op Plan:   Post-operative Plan:   Informed Consent: I have reviewed the patients History and Physical, chart, labs and discussed the procedure including the risks, benefits and alternatives for the proposed anesthesia with the patient or authorized representative who has indicated his/her understanding and acceptance.   Dental advisory given  Plan Discussed with: Anesthesiologist  Anesthesia Plan Comments:         Anesthesia Quick Evaluation

## 2016-11-12 ENCOUNTER — Encounter (HOSPITAL_COMMUNITY): Payer: Self-pay

## 2016-11-12 LAB — CBC
HCT: 28.7 % — ABNORMAL LOW (ref 36.0–46.0)
Hemoglobin: 9.3 g/dL — ABNORMAL LOW (ref 12.0–15.0)
MCH: 24.5 pg — ABNORMAL LOW (ref 26.0–34.0)
MCHC: 32.4 g/dL (ref 30.0–36.0)
MCV: 75.5 fL — ABNORMAL LOW (ref 78.0–100.0)
Platelets: 180 10*3/uL (ref 150–400)
RBC: 3.8 MIL/uL — ABNORMAL LOW (ref 3.87–5.11)
RDW: 15 % (ref 11.5–15.5)
WBC: 10.6 10*3/uL — ABNORMAL HIGH (ref 4.0–10.5)

## 2016-11-12 LAB — RPR: RPR Ser Ql: NONREACTIVE

## 2016-11-12 MED ORDER — DIPHENHYDRAMINE HCL 25 MG PO CAPS: 25.0000 mg | ORAL_CAPSULE | Freq: Four times a day (QID) | ORAL | Status: DC | PRN

## 2016-11-12 MED ORDER — DIBUCAINE 1 % RE OINT: 1.0000 "application " | TOPICAL_OINTMENT | RECTAL | Status: DC | PRN

## 2016-11-12 MED ORDER — OXYCODONE-ACETAMINOPHEN 5-325 MG PO TABS: 1.0000 | ORAL_TABLET | ORAL | Status: DC | PRN

## 2016-11-12 MED ORDER — MAGNESIUM HYDROXIDE 400 MG/5ML PO SUSP: 30.0000 mL | ORAL | Status: DC | PRN

## 2016-11-12 MED ORDER — ONDANSETRON HCL 4 MG/2ML IJ SOLN: 4.0000 mg | INTRAMUSCULAR | Status: DC | PRN

## 2016-11-12 MED ORDER — SIMETHICONE 80 MG PO CHEW: 80.0000 mg | CHEWABLE_TABLET | ORAL | Status: DC | PRN

## 2016-11-12 MED ORDER — WITCH HAZEL-GLYCERIN EX PADS: 1.0000 "application " | MEDICATED_PAD | CUTANEOUS | Status: DC | PRN

## 2016-11-12 MED ORDER — METHYLERGONOVINE MALEATE 0.2 MG PO TABS: 0.2000 mg | ORAL_TABLET | ORAL | Status: DC | PRN

## 2016-11-12 MED ORDER — METHYLERGONOVINE MALEATE 0.2 MG/ML IJ SOLN: 0.2000 mg | INTRAMUSCULAR | Status: DC | PRN

## 2016-11-12 MED ORDER — COCONUT OIL OIL: 1.0000 "application " | TOPICAL_OIL | Status: DC | PRN

## 2016-11-12 MED ORDER — ACETAMINOPHEN 325 MG PO TABS
650.0000 mg | ORAL_TABLET | ORAL | Status: DC | PRN
  Administered 2016-11-12 – 2016-11-13 (×2): 650 mg via ORAL
  Filled 2016-11-12 (×2): qty 2

## 2016-11-12 MED ORDER — SALINE SPRAY 0.65 % NA SOLN
1.0000 | NASAL | Status: DC | PRN
  Administered 2016-11-12: 1 via NASAL
  Filled 2016-11-12: qty 44

## 2016-11-12 MED ORDER — PRENATAL MULTIVITAMIN CH
1.0000 | ORAL_TABLET | Freq: Every day | ORAL | Status: DC
  Administered 2016-11-12 – 2016-11-13 (×2): 1 via ORAL
  Filled 2016-11-12 (×2): qty 1

## 2016-11-12 MED ORDER — IBUPROFEN 600 MG PO TABS
600.0000 mg | ORAL_TABLET | Freq: Four times a day (QID) | ORAL | Status: DC
  Administered 2016-11-12 – 2016-11-13 (×6): 600 mg via ORAL
  Filled 2016-11-12 (×6): qty 1

## 2016-11-12 MED ORDER — BENZOCAINE-MENTHOL 20-0.5 % EX AERO: 1.0000 "application " | INHALATION_SPRAY | CUTANEOUS | Status: DC | PRN

## 2016-11-12 MED ORDER — SENNOSIDES-DOCUSATE SODIUM 8.6-50 MG PO TABS
2.0000 | ORAL_TABLET | ORAL | Status: DC
  Administered 2016-11-12: 2 via ORAL
  Filled 2016-11-12: qty 2

## 2016-11-12 MED ORDER — OXYCODONE-ACETAMINOPHEN 5-325 MG PO TABS: 2.0000 | ORAL_TABLET | ORAL | Status: DC | PRN

## 2016-11-12 MED ORDER — TETANUS-DIPHTH-ACELL PERTUSSIS 5-2.5-18.5 LF-MCG/0.5 IM SUSP: 0.5000 mL | Freq: Once | INTRAMUSCULAR | Status: DC

## 2016-11-12 MED ORDER — ONDANSETRON HCL 4 MG PO TABS: 4.0000 mg | ORAL_TABLET | ORAL | Status: DC | PRN

## 2016-11-12 MED ORDER — OXYTOCIN 40 UNITS IN LACTATED RINGERS INFUSION - SIMPLE MED: 2.5000 [IU]/h | INTRAVENOUS | Status: DC | PRN

## 2016-11-12 MED ORDER — ZOLPIDEM TARTRATE 5 MG PO TABS: 5.0000 mg | ORAL_TABLET | Freq: Every evening | ORAL | Status: DC | PRN

## 2016-11-12 MED ORDER — GUAIFENESIN-DM 100-10 MG/5ML PO SYRP
5.0000 mL | ORAL_SOLUTION | ORAL | Status: DC | PRN
  Administered 2016-11-12 – 2016-11-13 (×2): 5 mL via ORAL
  Filled 2016-11-12 (×3): qty 5

## 2016-11-12 NOTE — Lactation Note (Signed)
This note was copied from a baby's chart. Lactation Consultation Note  Patient Name: Jennifer Rosales ZOXWR'UToday's Date: 11/12/2016 Reason for consult: Initial assessment;Term  Breastfeeding consultation services and support information given and reviewed.  This is mom's second baby and newborn is 1512 hours old.  Mom states baby is latching easily and has been to the breast 9 times.  Instructed to feed with cues and call for assist/concerns prn.  Maternal Data Does the patient have breastfeeding experience prior to this delivery?: Yes  Feeding Feeding Type: Breast Fed Length of feed: 18 min  LATCH Score Latch: Grasps breast easily, tongue down, lips flanged, rhythmical sucking.  Audible Swallowing: Spontaneous and intermittent  Type of Nipple: Everted at rest and after stimulation  Comfort (Breast/Nipple): Soft / non-tender  Hold (Positioning): No assistance needed to correctly position infant at breast.  LATCH Score: 10  Interventions    Lactation Tools Discussed/Used     Consult Status Consult Status: Follow-up Date: 11/13/16 Follow-up type: In-patient    Huston FoleyMOULDEN, Jamiria Langill S 11/12/2016, 2:11 PM

## 2016-11-12 NOTE — Progress Notes (Signed)
Pt doing well.  Passed some clots this am.  Requests removal of IV. VSS Fundus firm. Routine postpartum care.

## 2016-11-12 NOTE — Anesthesia Postprocedure Evaluation (Signed)
Anesthesia Post Note  Patient: Jennifer OxfordLindsey J Rosales  Procedure(s) Performed: * No procedures listed *     Patient location during evaluation: Mother Baby Anesthesia Type: Epidural Level of consciousness: awake and alert, oriented and patient cooperative Pain management: pain level controlled Vital Signs Assessment: post-procedure vital signs reviewed and stable Respiratory status: spontaneous breathing Cardiovascular status: stable Postop Assessment: no headache, epidural receding, patient able to bend at knees and no signs of nausea or vomiting Anesthetic complications: no Comments: Pain score 0.    Last Vitals:  Vitals:   11/12/16 0500 11/12/16 0600  BP: (!) 115/57 (!) 107/56  Pulse: 70 70  Resp: 18 20  Temp: 37.1 C 36.9 C  SpO2:      Last Pain:  Vitals:   11/12/16 0600  TempSrc: Oral  PainSc: 0-No pain   Pain Goal:                 [facility] LLCWRINKLE,Nidya Bouyer

## 2016-11-13 MED ORDER — FERROUS SULFATE 325 (65 FE) MG PO TABS: 325.0000 mg | ORAL_TABLET | Freq: Every day | ORAL | 0 refills | Status: DC

## 2016-11-13 MED ORDER — IBUPROFEN 600 MG PO TABS: 600.0000 mg | ORAL_TABLET | Freq: Four times a day (QID) | ORAL | 1 refills | Status: DC | PRN

## 2016-11-13 NOTE — Lactation Note (Signed)
This note was copied from a baby's chart. Lactation Consultation Note  Patient Name: Jennifer Rosales ZOXWR'UToday's Date: 11/13/2016 Reason for consult: Follow-up assessment;Infant weight loss (4% weight loss )  Baby is 2135 hours old Per  Mom the baby cluster fed all night and had to supplement this am, last feeding  Was at 1000 20 ml of formula.  LC started the consult at 1240 , and mom requested to try to latch, LC checked diaper,\ Noted to be dry, and stripped down to just the diaper to feed STS. Baby awake,  And rooting. LC assisted to latch on the right breast/ football/ after breast massage,  Hand expressed easily, and latched with compressions until swallows.  LC showed mom how to get the baby latched with depth.  Baby fed 15 mins with multiple swallows, increased with breast compressions.  Breast are still soft.  Mom reports breast changes with her 1st baby during pregnancy.  And for this baby some breast changes.  LC recommended due to her hx of low milk supply and gastric sleeve in 2017, to  Enhance milk supply would be to add post pumping after 4 feedings a day 10 -15 mins both breast.  Save milk and supplement back after the baby feeds both breast if needed.  Sore nipple and engorgement prevention and tx reviewed.  Per mom will have a DEBP at home.  LC also instructed mom on the use of hand pump/ #24 flange a good fit for today , and the shells due to areola  Edema.  LC answered mom's questions regarding milk supply , and foods that are best to eat.  LC recommended for mom to call her MD  Gastrologist due to her vitamins needed. Per mom had only been on the  PNV for pregnancy.  Per mom will be seeing Museum/gallery exhibitions officerBarb Carder RN, IBCLC at [facility] for [facility]ation.  Mother informed of post-discharge support and given phone number to the lactation department, including services for phone call assistance; out-patient appointments; and breastfeeding support group. List of other breastfeeding  resources in the community given in the handout. Encouraged mother to call for problems or concerns related to breastfeeding.    Maternal Data Has patient been taught Hand Expression?: Yes Does the patient have breastfeeding experience prior to this delivery?: Yes  Feeding Feeding Type: Breast Fed Nipple Type: Slow - flow Length of feed: 15 min (multiply swallows, increased with breast compressions )  LATCH Score Latch: Grasps breast easily, tongue down, lips flanged, rhythmical sucking.  Audible Swallowing: Spontaneous and intermittent  Type of Nipple: Everted at rest and after stimulation  Comfort (Breast/Nipple): Soft / non-tender  Hold (Positioning): Assistance needed to correctly position infant at breast and maintain latch.  LATCH Score: 9  Interventions Interventions: Breast feeding basics reviewed;Assisted with latch;Skin to skin;Breast massage;Hand express;Pre-pump if needed;Breast compression;Adjust position;Support pillows;Position options;Expressed milk;Shells;Hand pump;DEBP  Lactation Tools Discussed/Used Tools: Shells;Pump Shell Type: Inverted Breast pump type: Manual;Double-Electric Breast Pump WIC Program: No Pump Review: Setup, frequency, and cleaning Initiated by:: MAI  Date initiated:: 11/13/16   Consult Status Consult Status: Complete Date: 11/13/16    Kathrin GreathouseMargaret Ann Tylen Rosales 11/13/2016, 1:18 PM

## 2016-11-13 NOTE — Discharge Summary (Signed)
OB Discharge Summary     Patient Name: Jennifer Rosales DOB: 03-Feb-1986 MRN: [id number]  Date of admission: 11/11/2016 Delivering MD: Wende Crease   Date of discharge: 11/13/2016  Admitting diagnosis: 39WKS WATER BROKE STILL LEAKING CTX Intrauterine pregnancy: [redacted]w[redacted]d     Secondary diagnosis:  Active Problems:   PROM (premature rupture of membranes)   Normal labor  Additional problems: None     Discharge diagnosis: Term Pregnancy Delivered                                                                                                Post partum procedures:None  Augmentation: Pitocin  Complications: None  Hospital course:  Onset of Labor With Vaginal Delivery     31 y.o. yo [id number] at [redacted]w[redacted]d was admitted in Latent Labor on 11/11/2016. Patient had an uncomplicated labor course as follows:  Membrane Rupture Time/Date: 7:30 AM ,11/11/2016   Intrapartum Procedures: Episiotomy: None [1]                                         Lacerations:  2nd degree [3]  Patient had a delivery of a Viable infant. 11/12/2016  Information for the patient's newborn:  Bethani, Brugger Girl Kess [[id number]]  Delivery Method: Vaginal, Spontaneous Delivery (Filed from Delivery Summary)    Pateint had an uncomplicated postpartum course.  She is ambulating, tolerating a regular diet, passing flatus, and urinating well. Patient is discharged home in stable condition on 11/13/16.   Physical exam  Vitals:   11/12/16 0500 11/12/16 0600 11/12/16 1700 11/13/16 0520  BP: (!) 115/57 (!) 107/56 116/67 116/68  Pulse: 70 70 68 (!) 58  Resp: 18 20 18 18   Temp: 98.8 F (37.1 C) 98.4 F (36.9 C) 97.8 F (36.6 C) 97.8 F (36.6 C)  TempSrc: Oral Oral Oral Oral  SpO2:   98%   Weight:      Height:       General: alert, cooperative and no distress Lochia: appropriate Uterine Fundus: firm Incision: N/A DVT Evaluation: No evidence of DVT seen on physical exam. Labs: Lab Results  Component Value Date   WBC  10.6 (H) 11/12/2016   HGB 9.3 (L) 11/12/2016   HCT 28.7 (L) 11/12/2016   MCV 75.5 (L) 11/12/2016   PLT 180 11/12/2016   CMP Latest Ref Rng & Units 11/11/2016  Glucose 65 - 99 mg/dL 75  BUN 6 - 20 mg/dL 6  Creatinine 1.30 - 8.65 mg/dL 7.84  Sodium 696 - 295 mmol/L 136  Potassium 3.5 - 5.1 mmol/L 3.9  Chloride 101 - 111 mmol/L 105  CO2 22 - 32 mmol/L 22  Calcium 8.9 - 10.3 mg/dL 2.8(U)  Total Protein 6.5 - 8.1 g/dL 6.0(L)  Total Bilirubin 0.3 - 1.2 mg/dL 0.7  Alkaline Phos 38 - 126 U/L 119  AST 15 - 41 U/L 15  ALT 14 - 54 U/L 9(L)    Discharge instruction: per After Visit Summary and "Baby and Me Booklet".  After visit  meds:  Allergies as of 11/13/2016   No Known Allergies     Medication List    TAKE these medications   ferrous sulfate 325 (65 FE) MG tablet Commonly known as:  FERROUSUL Take 1 tablet (325 mg total) by mouth daily with breakfast.   ibuprofen 600 MG tablet Commonly known as:  ADVIL,MOTRIN Take 1 tablet (600 mg total) by mouth every 6 (six) hours as needed.   PRENATAL GUMMIES/DHA & FA 0.4-32.5 MG Chew Chew 1 each by mouth daily.       Diet: routine diet  Activity: Advance as tolerated. Pelvic rest for 6 weeks.   Outpatient follow up:6 weeks Follow up Appt:No future appointments. Follow up Visit:No Follow-up on file.  Postpartum contraception: Not Discussed  Newborn Data: Live born female  Birth Weight: 6 lb 8.6 oz (2965 g) APGAR: 9, 9  Baby Feeding: Bottle and Breast Disposition:home with mother   11/13/2016 Jessee AversOLE,Daimon Kean J., MD

## 2016-11-13 NOTE — Progress Notes (Signed)
Post discharge chart review completed.  

## 2017-06-28 DIAGNOSIS — D509 Iron deficiency anemia, unspecified: Secondary | ICD-10-CM | POA: Insufficient documentation

## 2017-11-28 ENCOUNTER — Emergency Department (HOSPITAL_COMMUNITY)
Admission: EM | Admit: 2017-11-28 | Discharge: 2017-11-29 | Disposition: A | Discharge status: Home / Self Care | Payer: BLUE CROSS/BLUE SHIELD | Attending: Emergency Medicine | Admitting: Emergency Medicine

## 2017-11-28 ENCOUNTER — Other Ambulatory Visit: Payer: Self-pay

## 2017-11-28 ENCOUNTER — Emergency Department (HOSPITAL_COMMUNITY): Payer: BLUE CROSS/BLUE SHIELD

## 2017-11-28 ENCOUNTER — Encounter (HOSPITAL_COMMUNITY): Payer: Self-pay | Admitting: Emergency Medicine

## 2017-11-28 DIAGNOSIS — R079 Chest pain, unspecified: Secondary | ICD-10-CM

## 2017-11-28 LAB — BASIC METABOLIC PANEL
Anion gap: 5 (ref 5–15)
BUN: 13 mg/dL (ref 6–20)
CO2: 29 mmol/L (ref 22–32)
Calcium: 9.8 mg/dL (ref 8.9–10.3)
Chloride: 106 mmol/L (ref 98–111)
Creatinine, Ser: 0.64 mg/dL (ref 0.44–1.00)
GFR calc Af Amer: >60 mL/min (ref >60)
GFR calc non Af Amer: >60 mL/min (ref >60)
Glucose, Bld: 87 mg/dL (ref 70–99)
Potassium: 3.7 mmol/L (ref 3.5–5.1)
Sodium: 140 mmol/L (ref 135–145)

## 2017-11-28 LAB — TROPONIN I: Troponin I: <0.03 ng/mL (ref <0.03)

## 2017-11-28 LAB — CBC
HCT: 41.8 % (ref 36.0–46.0)
Hemoglobin: 13.8 g/dL (ref 12.0–15.0)
MCH: 26.5 pg (ref 26.0–34.0)
MCHC: 33 g/dL (ref 30.0–36.0)
MCV: 80.2 fL (ref 78.0–100.0)
Platelets: 239 10*3/uL (ref 150–400)
RBC: 5.21 MIL/uL — ABNORMAL HIGH (ref 3.87–5.11)
RDW: 13.9 % (ref 11.5–15.5)
WBC: 7.6 10*3/uL (ref 4.0–10.5)

## 2017-11-28 LAB — HCG, SERUM, QUALITATIVE: Preg, Serum: NEGATIVE

## 2017-11-28 LAB — D-DIMER, QUANTITATIVE: D-Dimer, Quant: 0.59 ug/mL-FEU — ABNORMAL HIGH (ref 0.00–0.50)

## 2017-11-28 MED ORDER — IOPAMIDOL (ISOVUE-370) INJECTION 76%
100.0000 mL | Freq: Once | INTRAVENOUS | Status: AC | PRN
  Administered 2017-11-28: 100 mL via INTRAVENOUS

## 2017-11-28 MED ORDER — OXYCODONE-ACETAMINOPHEN 5-325 MG PO TABS
1.0000 | ORAL_TABLET | Freq: Once | ORAL | Status: AC
  Administered 2017-11-28: 1 via ORAL
  Filled 2017-11-28: qty 1

## 2017-11-28 NOTE — ED Triage Notes (Signed)
Pt C/O chest pain that started 2.5 hours ago. Pt denies N/V. Pt states moving her left arm and breathing makes the pain.

## 2017-11-28 NOTE — ED Provider Notes (Signed)
[facility] Provider Note   CSN: [id number] Arrival date & time: 11/28/17  2058     History   Chief Complaint Chief Complaint  Patient presents with  . Chest Pain    HPI Jennifer Rosales is a 32 y.o. female.   Chest Pain     Pt was seen at 2120. Per pt, c/o sudden onset and persistence of constant left upper chest wall "pain" that began approximately 1830/1900 PTA. Pt states the pain began while she was sitting and watching TV. Pt describes the pain as "sharp," constant, and worsens with palpation of the area and movement of her arm. Pt did not take any medications to treat her pain. Denies palpitations, no SOB/cough, no abd pain, no N/V/D, no fevers, no rash, no injury.   History reviewed. No pertinent past medical history.  There are no active problems to display for this patient.   Past Surgical History:  Procedure Laterality Date  . CHOLECYSTECTOMY    . LAPAROSCOPIC GASTRIC SLEEVE RESECTION       OB History   None      Home Medications    Prior to Admission medications   Not on File    Family History No family history on file.  Social History Social History   Tobacco Use  . Smoking status: Never Smoker  . Smokeless tobacco: Never Used  Substance Use Topics  . Alcohol use: Not Currently  . Drug use: Not Currently     Allergies   Patient has no allergy information on record.   Review of Systems Review of Systems  Cardiovascular: Positive for chest pain.  ROS: Statement: All systems negative except as marked or noted in the HPI; Constitutional: Negative for fever and chills. ; ; Eyes: Negative for eye pain, redness and discharge. ; ; ENMT: Negative for ear pain, hoarseness, nasal congestion, sinus pressure and sore throat. ; ; Cardiovascular: Negative for palpitations, diaphoresis, dyspnea and peripheral edema. ; ; Respiratory: Negative for cough, wheezing and stridor. ; ; Gastrointestinal: Negative for nausea, vomiting,  diarrhea, abdominal pain, blood in stool, hematemesis, jaundice and rectal bleeding. . ; ; Genitourinary: Negative for dysuria, flank pain and hematuria. ; ; Musculoskeletal: +chest wall pain. Negative for back pain and neck pain. Negative for swelling and trauma.; ; Skin: Negative for pruritus, rash, abrasions, blisters, bruising and skin lesion.; ; Neuro: Negative for headache, lightheadedness and neck stiffness. Negative for weakness, altered level of consciousness, altered mental status, extremity weakness, paresthesias, involuntary movement, seizure and syncope.        Physical Exam Updated Vital Signs BP 124/74   Pulse 70   Temp 97.8 F (36.6 C) (Oral)   Resp (!) 22   Ht 5\' 6"  (1.676 m)   Wt 90.3 kg   LMP 11/02/2017   SpO2 99%   BMI 32.12 kg/m   Physical Exam 2125: Physical examination:  Nursing notes reviewed; Vital signs and O2 SAT reviewed;  Constitutional: Well developed, Well nourished, Well hydrated, In no acute distress; Head:  Normocephalic, atraumatic; Eyes: EOMI, PERRL, No scleral icterus; ENMT: Mouth and pharynx normal, Mucous membranes moist; Neck: Supple, Full range of motion, No lymphadenopathy. No JVD.; Cardiovascular: Regular rate and rhythm, No gallop; Respiratory: Breath sounds clear & equal bilaterally, No wheezes.  Speaking full sentences with ease, Normal respiratory effort/excursion; Chest: +left upper parasternal and anterior chest wall areas tender to palp which reproduces pt's symptoms. No deformity, no soft tissue crepitus. Movement normal; Abdomen: Soft, Nontender, Nondistended, Normal bowel  sounds; Genitourinary: No CVA tenderness; Extremities: Peripheral pulses normal, No tenderness, No edema, No calf edema or asymmetry.; Neuro: AA&Ox3, Major CN grossly intact.  Speech clear. No gross focal motor or sensory deficits in extremities.; Skin: Color normal, Warm, Dry.   ED Treatments / Results  Labs (all labs ordered are listed, but only abnormal results are  displayed)   EKG EKG Interpretation  Date/Time:  Wednesday November 28 2017 21:04:44 EDT Ventricular Rate:  79 PR Interval:  158 QRS Duration: 72 QT Interval:  394 QTC Calculation: 451 R Axis:   20 Text Interpretation:  Normal sinus rhythm Cannot rule out Anterior infarct , age undetermined Baseline wander Artifact No old tracing to compare Confirmed by Samuel Jester [phone]) on 11/28/2017 9:25:18 PM   Radiology   Procedures Procedures (including critical care time)  Medications Ordered in ED Medications  oxyCODONE-acetaminophen (PERCOCET/ROXICET) 5-325 MG per tablet 1 tablet (1 tablet Oral Given 11/28/17 2136)     Initial Impression / Assessment and Plan / ED Course  I have reviewed the triage vital signs and the nursing notes.  Pertinent labs & imaging results that were available during my care of the patient were reviewed by me and considered in my medical decision making (see chart for details).  MDM Reviewed: previous chart, nursing note and vitals Reviewed previous: labs Interpretation: labs, ECG, x-ray and CT scan   Results for orders placed or performed during the hospital encounter of 11/28/17  Basic metabolic panel  Result Value Ref Range   Sodium 140 135 - 145 mmol/L   Potassium 3.7 3.5 - 5.1 mmol/L   Chloride 106 98 - 111 mmol/L   CO2 29 22 - 32 mmol/L   Glucose, Bld 87 70 - 99 mg/dL   BUN 13 6 - 20 mg/dL   Creatinine, Ser 6.04 0.44 - 1.00 mg/dL   Calcium 9.8 8.9 - 54.0 mg/dL   GFR calc non Af Amer >60 >60 mL/min   GFR calc Af Amer >60 >60 mL/min   Anion gap 5 5 - 15  CBC  Result Value Ref Range   WBC 7.6 4.0 - 10.5 K/uL   RBC 5.21 (H) 3.87 - 5.11 MIL/uL   Hemoglobin 13.8 12.0 - 15.0 g/dL   HCT 98.1 19.1 - 47.8 %   MCV 80.2 78.0 - 100.0 fL   MCH 26.5 26.0 - 34.0 pg   MCHC 33.0 30.0 - 36.0 g/dL   RDW 29.5 62.1 - 30.8 %   Platelets 239 150 - 400 K/uL  Troponin I  Result Value Ref Range   Troponin I <0.03 <0.03 ng/mL  hCG, serum, qualitative    Result Value Ref Range   Preg, Serum NEGATIVE NEGATIVE  D-dimer, quantitative  Result Value Ref Range   D-Dimer, Quant 0.59 (H) 0.00 - 0.50 ug/mL-FEU     Dg Chest 2 View Result Date: 11/28/2017 CLINICAL DATA:  32 year old female with chest pain. EXAM: CHEST - 2 VIEW COMPARISON:  None. FINDINGS: The heart size and mediastinal contours are within normal limits. Both lungs are clear. The visualized skeletal structures are unremarkable. IMPRESSION: No active cardiopulmonary disease. Electronically Signed   By: Elgie Collard M.D.   On: 11/28/2017 21:54    Ct Angio Chest Pe W/cm &/or Wo Cm Result Date: 11/28/2017 CLINICAL DATA:  Chest pain starting 2.5 hours ago. Patient states that moving left arm and breathing exacerbates the pain. EXAM: CT ANGIOGRAPHY CHEST WITH CONTRAST TECHNIQUE: Multidetector CT imaging of the chest was performed using the standard  protocol during bolus administration of intravenous contrast. Multiplanar CT image reconstructions and MIPs were obtained to evaluate the vascular anatomy. CONTRAST:  ISOVUE-370 IOPAMIDOL (ISOVUE-370) INJECTION 76% COMPARISON:  Same day CXR FINDINGS: Cardiovascular: Conventional branch pattern of the great vessels without stenosis. Motion related artifacts limit assessment of the thoracic aorta. No definite aneurysm or dissection given limitations of cardiac motion artifact. Satisfactory opacification of the pulmonary arteries without acute pulmonary embolus to the segmental level. Heart is top-normal in size. Small pericardial effusion is noted on the right measuring up to 8 mm. Soft tissue attenuation in the anterior superior mediastinum may reflect some residual thymic tissue. No coronary arteriosclerosis. Mediastinum/Nodes: Tiny subcentimeter hypodense nodules of the left thyroid gland too small to further characterize, the largest approximately 6 mm. No mediastinal or hilar adenopathy. Soft tissue attenuation in the anterior superior  mediastinum may reflect residual thymic tissue. Lungs/Pleura: No acute pulmonary consolidation. Passive atelectasis along the dependent aspect of both lower lobes. No dominant mass, pneumothorax nor effusion. Upper Abdomen: Status post bariatric surgery. No acute abnormality in the upper abdomen. Musculoskeletal: No chest wall abnormality. No acute or significant osseous findings. Review of the MIP images confirms the above findings. IMPRESSION: 1. No acute pulmonary embolus. 2. Small pericardial effusion measuring up to 8 mm in thickness. 3. No definite aortic aneurysm or dissection given limitations due to cardiac motion artifacts. 4. Triangular opacity in the anterior superior mediastinum likely reflects residual thymic tissue. 5. No active pulmonary disease. Electronically Signed   By: Tollie Eth M.D.   On: 11/28/2017 22:55    0015:   Initial troponin negative. CT-A chest negative for PE; possible pericardial effusion. No hemodynamic compromise; doubt significance of same today, and pt can f/u with PMD or Cards MD for outpt echocardiogram. Will obtain delta troponin 6 hours after onset of CP. Dx and testing d/w pt and family.  Questions answered.  Verb understanding, agreeable with plan. Sign out to Dr. Judd Lien.        Final Clinical Impressions(s) / ED Diagnoses   Final diagnoses:  None    ED Discharge Orders    None       Samuel Jester, DO 11/29/17 0019

## 2017-11-29 ENCOUNTER — Encounter (HOSPITAL_COMMUNITY): Payer: Self-pay

## 2017-11-29 LAB — TROPONIN I: Troponin I: <0.03 ng/mL (ref <0.03)

## 2017-11-29 NOTE — Discharge Instructions (Addendum)
Follow-up with your primary doctor in the next week, and return to the ER if your symptoms significantly worsen or change.

## 2018-12-13 DIAGNOSIS — E0500 Thyrotoxicosis with diffuse goiter without thyrotoxic crisis or storm: Secondary | ICD-10-CM | POA: Insufficient documentation

## 2019-04-04 NOTE — L&D Delivery Note (Signed)
Delivery Note At 5:01 PM a viable female was delivered via Vaginal, Spontaneous (Presentation:    Occiput anterior   ).  APGAR: 8, 9; weight pending   .   Placenta status: Spontaneous, Intact.  Cord: 3 vessels with the following complications: none  .  Cord pH: NA  Anesthesia: Epidural Episiotomy:  None Lacerations: 2nd degree Suture Repair: 3.0 vicryl Est. Blood Loss (mL):  150 mL   Mom to postpartum.  Baby to Couplet care / Skin to Skin.  Gerald Leitz 01/08/2020, 6:07 PM

## 2019-06-04 ENCOUNTER — Inpatient Hospital Stay (HOSPITAL_COMMUNITY)
Admission: AD | Admit: 2019-06-04 | Discharge: 2019-06-04 | Disposition: A | Discharge status: Home / Self Care | Payer: Medicaid Other | Attending: Obstetrics & Gynecology | Admitting: Obstetrics & Gynecology

## 2019-06-04 ENCOUNTER — Encounter (HOSPITAL_COMMUNITY): Payer: Self-pay | Admitting: *Deleted

## 2019-06-04 ENCOUNTER — Inpatient Hospital Stay (HOSPITAL_COMMUNITY): Payer: Medicaid Other

## 2019-06-04 DIAGNOSIS — R109 Unspecified abdominal pain: Secondary | ICD-10-CM | POA: Diagnosis not present

## 2019-06-04 DIAGNOSIS — Z3A08 8 weeks gestation of pregnancy: Secondary | ICD-10-CM | POA: Diagnosis not present

## 2019-06-04 DIAGNOSIS — O26891 Other specified pregnancy related conditions, first trimester: Secondary | ICD-10-CM | POA: Diagnosis not present

## 2019-06-04 DIAGNOSIS — Z3491 Encounter for supervision of normal pregnancy, unspecified, first trimester: Secondary | ICD-10-CM

## 2019-06-04 DIAGNOSIS — O468X1 Other antepartum hemorrhage, first trimester: Secondary | ICD-10-CM

## 2019-06-04 DIAGNOSIS — O210 Mild hyperemesis gravidarum: Secondary | ICD-10-CM | POA: Insufficient documentation

## 2019-06-04 DIAGNOSIS — O208 Other hemorrhage in early pregnancy: Secondary | ICD-10-CM | POA: Diagnosis not present

## 2019-06-04 DIAGNOSIS — O418X10 Other specified disorders of amniotic fluid and membranes, first trimester, not applicable or unspecified: Secondary | ICD-10-CM

## 2019-06-04 DIAGNOSIS — O219 Vomiting of pregnancy, unspecified: Secondary | ICD-10-CM | POA: Diagnosis not present

## 2019-06-04 DIAGNOSIS — R102 Pelvic and perineal pain: Secondary | ICD-10-CM | POA: Diagnosis not present

## 2019-06-04 HISTORY — DX: Thyrotoxicosis with diffuse goiter without thyrotoxic crisis or storm: E05.00

## 2019-06-04 LAB — URINALYSIS, ROUTINE W REFLEX MICROSCOPIC
Bilirubin Urine: NEGATIVE
Glucose, UA: NEGATIVE mg/dL
Hgb urine dipstick: NEGATIVE
Ketones, ur: 40 mg/dL — AB
Nitrite: NEGATIVE
Protein, ur: NEGATIVE mg/dL
Specific Gravity, Urine: >1.03 — ABNORMAL HIGH (ref 1.005–1.030)
pH: 6 (ref 5.0–8.0)

## 2019-06-04 LAB — CBC
HCT: 42.2 % (ref 36.0–46.0)
Hemoglobin: 13.7 g/dL (ref 12.0–15.0)
MCH: 26.1 pg (ref 26.0–34.0)
MCHC: 32.5 g/dL (ref 30.0–36.0)
MCV: 80.5 fL (ref 80.0–100.0)
Platelets: 242 10*3/uL (ref 150–400)
RBC: 5.24 MIL/uL — ABNORMAL HIGH (ref 3.87–5.11)
RDW: 14.2 % (ref 11.5–15.5)
WBC: 10.3 10*3/uL (ref 4.0–10.5)
nRBC: 0 % (ref 0.0–0.2)

## 2019-06-04 LAB — WET PREP, GENITAL
Clue Cells Wet Prep HPF POC: NONE SEEN
Sperm: NONE SEEN
Trich, Wet Prep: NONE SEEN
Yeast Wet Prep HPF POC: NONE SEEN

## 2019-06-04 LAB — URINALYSIS, MICROSCOPIC (REFLEX): RBC / HPF: NONE SEEN RBC/hpf (ref 0–5)

## 2019-06-04 LAB — POCT PREGNANCY, URINE: Preg Test, Ur: POSITIVE — AB

## 2019-06-04 MED ORDER — PROMETHAZINE HCL 25 MG/ML IJ SOLN
25.0000 mg | Freq: Once | INTRAMUSCULAR | Status: AC
  Administered 2019-06-04: 25 mg via INTRAVENOUS
  Filled 2019-06-04: qty 1

## 2019-06-04 MED ORDER — METOCLOPRAMIDE HCL 10 MG PO TABS: 10.0000 mg | ORAL_TABLET | Freq: Three times a day (TID) | ORAL | 2 refills | Status: DC | PRN

## 2019-06-04 MED ORDER — PROMETHAZINE HCL 12.5 MG PO TABS: 12.5000 mg | ORAL_TABLET | Freq: Every evening | ORAL | 2 refills | Status: DC | PRN

## 2019-06-04 MED ORDER — LACTATED RINGERS IV BOLUS
1000.0000 mL | Freq: Once | INTRAVENOUS | Status: AC
  Administered 2019-06-04: 20:00:00 1000 mL via INTRAVENOUS

## 2019-06-04 MED ORDER — LACTATED RINGERS IV BOLUS: 1000.0000 mL | Freq: Once | INTRAVENOUS | Status: DC

## 2019-06-04 NOTE — MAU Note (Signed)
.   Jennifer Rosales is a 34 y.o. at [redacted]w[redacted]d here in MAU reporting: nausea for approximately 2 weeks, lower abdominal cramping since Saturday. Reports a migraine headache that started today LMP: 04/09/19 Onset of complaint: couple of weeks Pain score: abd 7/10  Head 10/10 Vitals:   06/04/19 1807  BP: 136/80  Pulse: 90  Resp: 16  Temp: 98 F (36.7 C)  SpO2: 98%     FHT: Lab orders placed from triage: UA/UPT

## 2019-06-04 NOTE — MAU Provider Note (Signed)
Chief Complaint: Abdominal Pain, Headache, and Morning Sickness   First Provider Initiated Contact with Patient 06/04/19 1846      SUBJECTIVE HPI: Jennifer Rosales is a 34 y.o. G3P2002 at [redacted]w[redacted]d by LMP who presents to maternity admissions reporting n/v x 2 weeks, headache and abdominal cramping x 3 days.  She is keeping down some food and fluids but vomiting at least 2-3 times daily. Her headache started as a mild frontal h/a but has now progressed to pain on the right side, behind her eye, with light sensitivity.  The abdominal cramping is low in her abdomen, in the middle, intermittent, and does not radiate. She has not yet started care in this pregnancy but has an appt with [facility].   HPI  Past Medical History:  Diagnosis Date  . Graves disease   . Obesity    Past Surgical History:  Procedure Laterality Date  . CHOLECYSTECTOMY    . GALLBLADDER SURGERY  2008  . LAPAROSCOPIC GASTRIC SLEEVE RESECTION    . TONSILLECTOMY    . TONSILLECTOMY AND ADENOIDECTOMY    . WISDOM TOOTH EXTRACTION     Social History   Socioeconomic History  . Marital status: Unknown    Spouse name: Not on file  . Number of children: Not on file  . Years of education: Not on file  . Highest education level: Not on file  Occupational History  . Not on file  Tobacco Use  . Smoking status: Never Smoker  . Smokeless tobacco: Never Used  Substance and Sexual Activity  . Alcohol use: Not Currently  . Drug use: Not Currently  . Sexual activity: Yes  Other Topics Concern  . Not on file  Social History Narrative   ** Merged History Encounter **       Social Determinants of Health   Financial Resource Strain:   . Difficulty of Paying Living Expenses: Not on file  Food Insecurity:   . Worried About Charity fundraiser in the Last Year: Not on file  . Ran Out of Food in the Last Year: Not on file  Transportation Needs:   . Lack of Transportation (Medical): Not on file  . Lack of Transportation  (Non-Medical): Not on file  Physical Activity:   . Days of Exercise per Week: Not on file  . Minutes of Exercise per Session: Not on file  Stress:   . Feeling of Stress : Not on file  Social Connections:   . Frequency of Communication with Friends and Family: Not on file  . Frequency of Social Gatherings with Friends and Family: Not on file  . Attends Religious Services: Not on file  . Active Member of Clubs or Organizations: Not on file  . Attends Archivist Meetings: Not on file  . Marital Status: Not on file  Intimate Partner Violence:   . Fear of Current or Ex-Partner: Not on file  . Emotionally Abused: Not on file  . Physically Abused: Not on file  . Sexually Abused: Not on file   No current facility-administered medications on file prior to encounter.   Current Outpatient Medications on File Prior to Encounter  Medication Sig Dispense Refill  . atenolol (TENORMIN) 25 MG tablet Take by mouth as needed.    . Prenatal MV-Min-FA-Omega-3 (PRENATAL GUMMIES/DHA & FA) 0.4-32.5 MG CHEW Chew 1 each by mouth daily.    Marland Kitchen propylthiouracil (PTU) 50 MG tablet Take 100 mg by mouth daily.    Marland Kitchen venlafaxine (EFFEXOR) 37.5  MG tablet Take 37.5 mg by mouth 2 (two) times daily.    . ferrous sulfate (FERROUSUL) 325 (65 FE) MG tablet Take 1 tablet (325 mg total) by mouth daily with breakfast. 30 tablet 0  . ibuprofen (ADVIL,MOTRIN) 600 MG tablet Take 1 tablet (600 mg total) by mouth every 6 (six) hours as needed. 30 tablet 1   No Known Allergies  ROS:  Review of Systems  Constitutional: Negative for chills, fatigue and fever.  Respiratory: Negative for shortness of breath.   Cardiovascular: Negative for chest pain.  Gastrointestinal: Positive for abdominal pain, nausea and vomiting.  Genitourinary: Negative for difficulty urinating, dysuria, flank pain, pelvic pain, vaginal bleeding, vaginal discharge and vaginal pain.  Neurological: Negative for dizziness and headaches.   Psychiatric/Behavioral: Negative.      I have reviewed patient's Past Medical Hx, Surgical Hx, Family Hx, Social Hx, medications and allergies.   Physical Exam   Patient Vitals for the past 24 hrs:  BP Temp Pulse Resp SpO2 Height Weight  06/04/19 1807 136/80 98 F (36.7 C) 90 16 98 % 5\' 6"  (1.676 m) 102.5 kg   Constitutional: Well-developed, well-nourished female in no acute distress.  Cardiovascular: normal rate Respiratory: normal effort GI: Abd soft, non-tender. Pos BS x 4 MS: Extremities nontender, no edema, normal ROM Neurologic: Alert and oriented x 4.  GU: Neg CVAT.  PELVIC EXAM: Wet prep/GCC collected by blind swab   LAB RESULTS Results for orders placed or performed during the hospital encounter of 06/04/19 (from the past 24 hour(s))  Urinalysis, Routine w reflex microscopic     Status: Abnormal   Collection Time: 06/04/19  6:00 PM  Result Value Ref Range   Color, Urine YELLOW YELLOW   APPearance CLEAR CLEAR   Specific Gravity, Urine >1.030 (H) 1.005 - 1.030   pH 6.0 5.0 - 8.0   Glucose, UA NEGATIVE NEGATIVE mg/dL   Hgb urine dipstick NEGATIVE NEGATIVE   Bilirubin Urine NEGATIVE NEGATIVE   Ketones, ur 40 (A) NEGATIVE mg/dL   Protein, ur NEGATIVE NEGATIVE mg/dL   Nitrite NEGATIVE NEGATIVE   Leukocytes,Ua SMALL (A) NEGATIVE  Urinalysis, Microscopic (reflex)     Status: Abnormal   Collection Time: 06/04/19  6:00 PM  Result Value Ref Range   RBC / HPF NONE SEEN 0 - 5 RBC/hpf   WBC, UA 6-10 0 - 5 WBC/hpf   Bacteria, UA FEW (A) NONE SEEN   Squamous Epithelial / LPF 6-10 0 - 5   Mucus PRESENT    Urine-Other LESS THAN 10 mL OF URINE SUBMITTED   Pregnancy, urine POC     Status: Abnormal   Collection Time: 06/04/19  6:01 PM  Result Value Ref Range   Preg Test, Ur POSITIVE (A) NEGATIVE  Wet prep, genital     Status: Abnormal   Collection Time: 06/04/19  7:34 PM   Specimen: Vaginal  Result Value Ref Range   Yeast Wet Prep HPF POC NONE SEEN NONE SEEN   Trich,  Wet Prep NONE SEEN NONE SEEN   Clue Cells Wet Prep HPF POC NONE SEEN NONE SEEN   WBC, Wet Prep HPF POC MANY (A) NONE SEEN   Sperm NONE SEEN   CBC     Status: Abnormal   Collection Time: 06/04/19  8:30 PM  Result Value Ref Range   WBC 10.3 4.0 - 10.5 K/uL   RBC 5.24 (H) 3.87 - 5.11 MIL/uL   Hemoglobin 13.7 12.0 - 15.0 g/dL   HCT 08/04/19 03.5 -  46.0 %   MCV 80.5 80.0 - 100.0 fL   MCH 26.1 26.0 - 34.0 pg   MCHC 32.5 30.0 - 36.0 g/dL   RDW 78.2 95.6 - 21.3 %   Platelets 242 150 - 400 K/uL   nRBC 0.0 0.0 - 0.2 %       IMAGING US OB Comp Less 14 Wks  Result Date: 06/04/2019 CLINICAL DATA:  Pelvic pain.  Last menstrual period April 09, 2019. EXAM: OBSTETRIC <14 WK ULTRASOUND TECHNIQUE: Transabdominal ultrasound was performed for evaluation of the gestation as well as the maternal uterus and adnexal regions. COMPARISON:  June 26, 2016 FINDINGS: A single live intrauterine gestation. Heart Rate: 169 bpm CRL:   19.7 mm   8 w 3 d                  [place] EDC: January 11, 2020 Subchorionic hemorrhage:  10 x 4 mm. Maternal uterus/adnexae: Normal. The right ovary measures 2.9 x 1.2 x 2.5 cm. The left ovary measures 2.7 x 1.9 x 2.0 cm. There is a left corpus luteum. IMPRESSION: Single live intrauterine pregnancy with normal heart rate and crown rump length measuring 8 weeks 3 days concurrent with gestational age by last menstrual period 8 weeks 0 days. Estimated date of confinement by ultrasonography is October 10th 2021. Tiny 10 x 4 mm subchorionic hemorrhage. Normal bilateral maternal ovaries and left corpus luteum. Electronically Signed   By: Laurence Ferrari   On: 06/04/2019 21:38    MAU Management/MDM: Orders Placed This Encounter  Procedures  . Wet prep, genital  . US OB Comp Less 14 Wks  . Urinalysis, Routine w reflex microscopic  . Urinalysis, Microscopic (reflex)  . CBC  . Pregnancy, urine POC  . Discharge patient    Meds ordered this encounter  Medications  . lactated ringers bolus 1,000 mL   . promethazine (PHENERGAN) injection 25 mg  . lactated ringers bolus 1,000 mL  . metoCLOPramide (REGLAN) 10 MG tablet    Sig: Take 1 tablet (10 mg total) by mouth 3 (three) times daily with meals as needed for nausea.    Dispense:  30 tablet    Refill:  2    Order Specific Question:   Supervising Provider    Answer:   Jaynie Collins A [[id number]]  . promethazine (PHENERGAN) 12.5 MG tablet    Sig: Take 1 tablet (12.5 mg total) by mouth at bedtime as needed for nausea or vomiting.    Dispense:  30 tablet    Refill:  2    Order Specific Question:   Supervising Provider    Answer:   Jaynie Collins A [[id number]]    IUP confirmed by today's [place] with tiny subchorionic hemorrhage. Reviewed findings with patient.  Pt had episode of dizziness following Phenergan administration so [place] was performed at bedside.  Pt feeling better after IV fluids and rest.  Headache resolved.  CBC with no evidence of anemia.  D/C home. F/U as scheduled with [facility], return to MAU as needed for emergencies.    ASSESSMENT 1. Nausea and vomiting during pregnancy prior to [redacted] weeks gestation   2. Abdominal pain in pregnancy, first trimester   3. Normal IUP (intrauterine pregnancy) on prenatal ultrasound, first trimester   4. Subchorionic hemorrhage of placenta in first trimester, single or unspecified fetus     PLAN Discharge home Allergies as of 06/04/2019   No Known Allergies     Medication List    STOP taking these  medications   ibuprofen 600 MG tablet Commonly known as: ADVIL     TAKE these medications   atenolol 25 MG tablet Commonly known as: TENORMIN Take by mouth as needed.   ferrous sulfate 325 (65 FE) MG tablet Commonly known as: FerrouSul Take 1 tablet (325 mg total) by mouth daily with breakfast.   metoCLOPramide 10 MG tablet Commonly known as: REGLAN Take 1 tablet (10 mg total) by mouth 3 (three) times daily with meals as needed for nausea.   Prenatal Gummies/DHA & FA 0.4-32.5 MG Chew Chew 1  each by mouth daily.   promethazine 12.5 MG tablet Commonly known as: PHENERGAN Take 1 tablet (12.5 mg total) by mouth at bedtime as needed for nausea or vomiting.   propylthiouracil 50 MG tablet Commonly known as: PTU Take 100 mg by mouth daily.   venlafaxine 37.5 MG tablet Commonly known as: EFFEXOR Take 37.5 mg by mouth 2 (two) times daily.      Follow-up Information    Gynecology, [facility] Follow up.   Specialty: Obstetrics and Gynecology Why: As scheduled for new OB visit, return to MAU as needed for emergencies. Contact information: [address] [place] [address] [phone]           Sharen Counter Certified Nurse-Midwife 06/04/2019  9:53 PM

## 2019-06-05 LAB — GC/CHLAMYDIA PROBE AMP (~~LOC~~) NOT AT ARMC
Chlamydia: NEGATIVE
Comment: NEGATIVE
Comment: NORMAL
Neisseria Gonorrhea: NEGATIVE

## 2019-06-09 LAB — OB RESULTS CONSOLE HEPATITIS B SURFACE ANTIGEN: Hepatitis B Surface Ag: NEGATIVE

## 2019-06-09 LAB — OB RESULTS CONSOLE HIV ANTIBODY (ROUTINE TESTING): HIV: NONREACTIVE

## 2019-06-09 LAB — OB RESULTS CONSOLE RUBELLA ANTIBODY, IGM: Rubella: IMMUNE

## 2019-08-25 ENCOUNTER — Other Ambulatory Visit: Payer: Self-pay | Admitting: Obstetrics and Gynecology

## 2019-08-25 DIAGNOSIS — O35AXX1 Maternal care for other (suspected) fetal abnormality and damage, fetal facial anomalies, fetus 1: Secondary | ICD-10-CM

## 2019-08-25 DIAGNOSIS — O358XX1 Maternal care for other (suspected) fetal abnormality and damage, fetus 1: Secondary | ICD-10-CM

## 2019-09-15 ENCOUNTER — Ambulatory Visit: Payer: Self-pay | Admitting: Obstetrics and Gynecology

## 2019-09-15 ENCOUNTER — Other Ambulatory Visit: Payer: Self-pay

## 2019-09-15 ENCOUNTER — Ambulatory Visit: Payer: Medicaid Other

## 2019-09-15 ENCOUNTER — Ambulatory Visit (HOSPITAL_BASED_OUTPATIENT_CLINIC_OR_DEPARTMENT_OTHER): Payer: Medicaid Other | Admitting: Genetic Counselor

## 2019-09-15 ENCOUNTER — Ambulatory Visit: Payer: Medicaid Other | Admitting: *Deleted

## 2019-09-15 ENCOUNTER — Ambulatory Visit: Payer: Medicaid Other | Attending: Obstetrics and Gynecology

## 2019-09-15 VITALS — BP 117/74 | HR 82 | Wt 233.0 lb

## 2019-09-15 DIAGNOSIS — O359XX0 Maternal care for (suspected) fetal abnormality and damage, unspecified, not applicable or unspecified: Secondary | ICD-10-CM

## 2019-09-15 DIAGNOSIS — O283 Abnormal ultrasonic finding on antenatal screening of mother: Secondary | ICD-10-CM | POA: Diagnosis not present

## 2019-09-15 DIAGNOSIS — O0990 Supervision of high risk pregnancy, unspecified, unspecified trimester: Secondary | ICD-10-CM

## 2019-09-15 DIAGNOSIS — O358XX1 Maternal care for other (suspected) fetal abnormality and damage, fetus 1: Secondary | ICD-10-CM | POA: Insufficient documentation

## 2019-09-15 DIAGNOSIS — O35AXX0 Maternal care for other (suspected) fetal abnormality and damage, fetal facial anomalies, not applicable or unspecified: Secondary | ICD-10-CM

## 2019-09-15 DIAGNOSIS — O358XX0 Maternal care for other (suspected) fetal abnormality and damage, not applicable or unspecified: Secondary | ICD-10-CM | POA: Diagnosis not present

## 2019-09-15 DIAGNOSIS — Z3A22 22 weeks gestation of pregnancy: Secondary | ICD-10-CM

## 2019-09-15 DIAGNOSIS — O99282 Endocrine, nutritional and metabolic diseases complicating pregnancy, second trimester: Secondary | ICD-10-CM

## 2019-09-15 DIAGNOSIS — Z363 Encounter for antenatal screening for malformations: Secondary | ICD-10-CM

## 2019-09-15 DIAGNOSIS — Z315 Encounter for genetic counseling: Secondary | ICD-10-CM

## 2019-09-15 DIAGNOSIS — E0590 Thyrotoxicosis, unspecified without thyrotoxic crisis or storm: Secondary | ICD-10-CM

## 2019-09-15 DIAGNOSIS — O35AXX1 Maternal care for other (suspected) fetal abnormality and damage, fetal facial anomalies, fetus 1: Secondary | ICD-10-CM

## 2019-09-15 NOTE — Progress Notes (Signed)
09/15/2019  Jennifer Rosales 1986-02-17 MRN: [id number] DOV: 09/15/2019  Jennifer Rosales presented to the [facility] for a genetics consultation regarding fetal cleft lip and palate identified on ultrasound. Jennifer Rosales came to her appointment alone due to COVID-19 visitor restrictions.   Indication for genetic counseling - Fetal cleft lip and palate identified on ultrasound  Prenatal history  Jennifer Rosales is a [id number], 34 y.o. female. Her current pregnancy has completed [redacted]w[redacted]d (Estimated Date of Delivery: 01/14/20). Jennifer Rosales and her husband have a 71 year old son and a three year old daughter.  Jennifer Rosales denied exposure to environmental toxins or chemical agents. She denied the use of alcohol, tobacco or street drugs. She denied significant viral illnesses, fevers, and bleeding during the course of her pregnancy. Jennifer Rosales reported a personal history of Graves disease being treated with PTU and anemia. She has had gastric sleeve surgery and a cholecystectomy. Her medical and surgical histories were otherwise noncontributory.  Family History  A three generation pedigree was drafted and reviewed. The family history is remarkable for the following:  - Jennifer Rosales's brother and father both have a "hole in their hearts". Her father's required surgical correction. The exact etiology of this congenital heart defect (CHD) is unknown. There are multifactorial causes for isolated CHDs, including environmental and genetic factors. We discussed that without knowing the exact etiology, the chance that Jennifer Rosales could have a child with a CHD may be ~1%.   - Jennifer Rosales has a paternal aunt who had four spontaneous miscarriages. She also has a healthy son and daughter. We reviewed that there are many potential causes of recurrent miscarriages, including anatomic, immunologic, endocrine, and genetic factors. However, a cause is only identified in 50% of individuals who experience  recurrent miscarriages. Abnormalities of chromosome number or structure are the most common cause of sporadic early pregnancy loss. A significant proportion of recurrent miscarriages may also be associated with structural or numerical chromosome abnormalities. We discussed that 2-5% of couples who experience multiple miscarriages carry a balanced translocation that can become unbalanced and potentially lethal in their offspring. In addition to chromosomal abnormalities, certain single gene, X-linked, and polygenic/multifactorial disorders are associated with recurrent miscarriage. Jennifer Rosales was informed if she were interested, her aunt could have an evaluation with a prenatal genetic counselor.  - Jennifer Rosales's brother had brain cancer that was diagnosed in childhood. Her father also had prostate cancer at the age of 60. Jennifer Rosales's partner, Norva Pavlov "Thurston Pounds" Serna's mother died of breast cancer over the age of 41. His father died of lung cancer at age 49. He had a history of smoking. Mr. Vanmaanen paternal aunt has a son who died in his 58s of cancer. Ms. Brunson was not sure of the specific type of cancer he had. Though most cancers are thought to be sporadic or due to environmental factors, some families appear to have a strong predisposition to cancers. When considering a family history of cancer, we look for common types of cancer in multiple family members occurring at younger than typical ages. We discussed the option of meeting with a cancer genetic counselor to discuss any possible screening or testing options available. If Jennifer Rosales or her partner are concerned about the family history of cancer and would like to learn more about the family's chance for an inherited cancer syndrome, their healthcare provider may refer them to the [facility] [phone]).   - Mr. Greenspan has hearing loss  that was diagnosed at age 86 after a bout of spinal meningitis. Ms. Begay also has a family history of  congenital deafness; her paternal great grandmother, her paternal grandmother's sister, and her paternal grandmother's sister's daughter and son are all deaf. We discussed that there are several possible explanations for the history of hearing loss, including genetic conditions, environmental factors (such as meningitis), or errors in embryonal/fetal development. Up to 80% of congenital hearing loss is caused by genetic factors and 20% is caused by environmental factors. Of that 80%, 20% is syndromic and 80% is nonsyndromic. We briefly reviewed that there are various possible patterns of inheritance associated with hearing loss. Ms. Freeburg was counseled that her chance to having a child with hearing loss could be up to 1 in 16 (6.25%) if there is an autosomal dominant hearing loss condition with reduced penetrance in her family. We reviewed that all babies born in the state of [place] are screened for hearing loss at birth.    The remaining family histories were reviewed and found to be noncontributory for birth defects, intellectual disability, recurrent pregnancy loss, and known genetic conditions.    The patient's ethnicity is Caucasian. The father of the pregnancy's ethnicity is Caucasian. Ashkenazi Jewish ancestry and consanguinity were denied. Pedigree will be scanned under Media.  Discussion  Cleft lip and palate:  Jennifer Rosales was referred for a genetic counseling consultation to discuss the probable right-sided fetal cleft lip and palate identified on today's ultrasound. The ultrasound report will be sent under separate cover.    We discussed the ultrasound finding of cleft lip and palate. In normal embryological development, the fetal lip usually closes by 7-8 weeks' gestation and the fetal palate usually closes by 12 weeks' gestation. When parts of these structures do not fuse properly, cleft lip and/or palate (CL/P) results. CL/P is twice as common in males as it is in females.  Approximately 25% of all CL/P cases are cleft lip only, 50% are cleft lip and palate, and 25% are cleft palate only. Cleft lip is more commonly unilateral than bilateral, with left-sided cleft lip more common than right-sided cleft lip. Individuals with CL/P may experience feeding difficulties, failure to gain weight, poor growth, frequent ear infections, dental problems, and speech difficulties. Treatment generally includes surgery to restore normal function and appearance of the lip and palate.    We discussed that congenital birth defects occur in 3-5% of all pregnancies. CL/P is among the most common birth defects. The incidence of CL/P varies in different ethnic populations. In addition to ethnicity, other factors may increase the chance of CL/P, including some prenatal exposures, maternal alcohol, cigarette, and drug use, pre-gestational maternal diabetes, or folic acid deficiency.   CL/P is most often an isolated condition, but can be present in combination with other birth defects possibly as part of a genetic syndrome. Approximately 70% of CL/P cases are nonsyndromic, occurring in isolation without additional birth defects. Over 200 genetic conditions are associated with syndromic cases of CL/P, many of which may not be identified on ultrasound and would not be detected by amniocentesis. For this reason, a genetics evaluation may be recommended sometime after birth in order to assess for an underlying genetic syndrome. When there is no syndrome identified as the cause, then CL/P is typically suspected to be caused by a combination of genetic and environmental factors (multifactorial inheritance). Isolated CL/P has a recurrence risk of 2.2-4% depending on family history. The risk of recurrence also changes slightly based  on the severity and laterality of CL/P.    Given that the finding of CL/P is associated with an increased risk for additional fetal structural anomalies, we discussed the option of a  fetal echocardiogram. Additionally, we discussed the option of meeting with a pediatric plastic and reconstructive surgeon to discuss postnatal management and treatment of CL/P. Jennifer Rosales reported that she has already been exploring craniofacial teams in the area and would prefer to be seen at [facility] as this is closest to home for her.   Expanded carrier screening:  Jennifer Rosales was also counseled about expanded carrier screening (ECS). We discussed that ECS is a testing option that evaluates an individual's carrier status for a wide range of genetic conditions, including some in which CL/P can be a feature. ECS panels can include >200 autosomal recessive or X-linked genetic conditions. Ms. Herringshaw was counseled that if she were found to be a carrier for one or more recessive conditions, carrier screening for those conditions would be recommended for her partner. This could help determine whether the current or future pregnancies with her partner have an increased chance of being affected by a particular recessive genetic condition.    Although ECS is able to identify an individual's carrier status for many genetic disorders, it also has some limitations. We reviewed that ECS could possibly reveal information about Ms. Coco's own health that she was previously unaware of. For example, some genes included on ECS panels confer an increased risk for cancer, dementia, and other health problems for carriers. Additionally, some of the conditions included on ECS are rare while others may occur more commonly. Some conditions may be severe and actionable, whereas other conditions may not yet be well-understood or may not have treatment options available. Additionally, a negative result does not indicate that there is no risk for any genetic condition in the couple's children, as ECS does not evaluate for all possible genetic conditions or all possible mutations in the genes included on the panel. Additionally, it  is possible that a variant of uncertain significance (VUS) may be identified but not reported. A VUS means that a change was identified in one of the genes that was tested; however, it is unclear if this change is benign (not associated disease) or pathogenic (is known to be associated with disease). Laboratories generally do not report out VUSs on ECS, but may revise a report in the future if there is evidence that the VUS is pathogenic. Finally, ECS is not diagnostic. Even if one or both parents are found to be carriers for a condition, their children would not be guaranteed to be affected. Ms. Schaffer understood these risks and limitations and indicated that she was interested in pursuing ECS.   Aneuploidy screening results:  We reviewed that Ms. Zeis reportedly had Panorama NIPS through the laboratory [facility] that was low-risk for fetal aneuploidies. We reviewed that these results significantly decrease the risk for trisomies 34, 18 and 13, monosomy X (Turner syndrome), triploidy, and sex chromosome trisomies (47,XXX and 47,XXY). We reviewed that while this testing identifies 94-99% of pregnancies with trisomy 31, trisomy 72, trisomy 35, sex chromosome aneuploidies, and triploidy, it is NOT diagnostic. A positive test result requires confirmation by CVS or amniocentesis, and a negative test result does not rule out a fetal chromosome abnormality. She also understands that this testing does not identify all genetic conditions.  Diagnostic testing:  Ms. Beidleman was also counseled regarding diagnostic testing via amniocentesis. We discussed the technical aspects  of the procedure and quoted up to a 1 in 500 (0.2%) risk for spontaneous pregnancy loss or other adverse pregnancy outcomes as a result of amniocentesis. Cultured cells from an amniocentesis sample allow for the visualization of a fetal karyotype, which can detect >99% of chromosomal aberrations. Chromosomal microarray can also be performed to  identify smaller deletions or duplications of fetal chromosomal material. After careful consideration, Ms. Zeitler declined amniocentesis at this time. She understands that amniocentesis is available at any point after 16 weeks of pregnancy and that she may opt to undergo the procedure at a later date should she change her mind.  Plan:  Ms. Laflamme had her blood drawn for [facility] Comprehensive Carrier Screening today. Results will take 2-3 weeks to be returned. I will call Ms. Delira once her results become available. If carrier screening for her partner is recommended based on her results, I will help to facilitate testing from there. We will also place referrals for a fetal echocardiogram and a prenatal consultation with the pediatric craniofacial team at [facility].  I counseled Ms. Shawley regarding the above risks and available options. The approximate face-to-face time with the genetic counselor was 40 minutes.  In summary:  Reviewed results of ultrasound and options for follow-up testing  Probable right-sided cleft lip & palate identified  Opted to undergo expanded carrier screening. We will follow results  We will place referral for fetal echocardiogram and consultation with pediatric craniofacial team at [facility]  Reviewed NIPS results  Reportedly had low-risk Panorama NIPS  Reduction in risk for fetal aneuploidy  Offered additional testing and screening  Declined amniocentesis  Reviewed family history concerns   Gershon Crane, MS, Aeronautical engineer

## 2019-09-15 NOTE — Progress Notes (Signed)
[facility] carrier screen drawn per [facility], [facility].

## 2019-09-16 ENCOUNTER — Other Ambulatory Visit: Payer: Self-pay | Admitting: *Deleted

## 2019-09-16 DIAGNOSIS — O358XX0 Maternal care for other (suspected) fetal abnormality and damage, not applicable or unspecified: Secondary | ICD-10-CM

## 2019-09-16 DIAGNOSIS — O35AXX0 Maternal care for other (suspected) fetal abnormality and damage, fetal facial anomalies, not applicable or unspecified: Secondary | ICD-10-CM

## 2019-10-03 ENCOUNTER — Telehealth: Payer: Self-pay | Admitting: Genetic Counselor

## 2019-10-03 NOTE — Telephone Encounter (Signed)
LVM for [name] informing her of her results from [employer] Carrier Screening. 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 She was negative for the other 288 genes included on this carrier screening panel, reducing her chances of being a carrier for these other conditions (listed separately in the testing report).  CERKL-related conditions are a group of retinal dystrophies that include cone-rod dystrophy and retinitis pigmentosa. These conditions are characterized by symptoms including vision loss, loss of color perception, photophobia, and night blindness. CERKL-related conditions are not known to be associated with cleft lip and/or palate, so this carrier screening result likely has no relation to the findings identified on [name]'s ultrasound.   CERKL-related conditions are inherited in an autosomal recessive fashion. This means that [name]'s partner must also be a carrier for the conditions to have a 1 in 4 (25%) chance of having an affected child. I informed [name] that carrier screening is recommended for CERKL-related conditions is recommended and encouraged her to contact me to facilitate this testing.   Gershon Crane, MS, [facility] Genetic Counselor

## 2019-10-14 ENCOUNTER — Other Ambulatory Visit: Payer: Self-pay

## 2019-10-14 ENCOUNTER — Ambulatory Visit: Payer: Medicaid Other | Admitting: *Deleted

## 2019-10-14 ENCOUNTER — Ambulatory Visit: Payer: Medicaid Other | Attending: Obstetrics

## 2019-10-14 ENCOUNTER — Other Ambulatory Visit: Payer: Self-pay | Admitting: *Deleted

## 2019-10-14 VITALS — BP 123/78 | HR 111

## 2019-10-14 DIAGNOSIS — O0990 Supervision of high risk pregnancy, unspecified, unspecified trimester: Secondary | ICD-10-CM | POA: Diagnosis present

## 2019-10-14 DIAGNOSIS — E0590 Thyrotoxicosis, unspecified without thyrotoxic crisis or storm: Secondary | ICD-10-CM | POA: Diagnosis not present

## 2019-10-14 DIAGNOSIS — O359XX0 Maternal care for (suspected) fetal abnormality and damage, unspecified, not applicable or unspecified: Secondary | ICD-10-CM

## 2019-10-14 DIAGNOSIS — O35AXX0 Maternal care for other (suspected) fetal abnormality and damage, fetal facial anomalies, not applicable or unspecified: Secondary | ICD-10-CM

## 2019-10-14 DIAGNOSIS — O99282 Endocrine, nutritional and metabolic diseases complicating pregnancy, second trimester: Secondary | ICD-10-CM

## 2019-10-14 DIAGNOSIS — O358XX0 Maternal care for other (suspected) fetal abnormality and damage, not applicable or unspecified: Secondary | ICD-10-CM

## 2019-10-14 DIAGNOSIS — Z363 Encounter for antenatal screening for malformations: Secondary | ICD-10-CM | POA: Diagnosis not present

## 2019-10-16 ENCOUNTER — Inpatient Hospital Stay (HOSPITAL_COMMUNITY)
Admission: AD | Admit: 2019-10-16 | Discharge: 2019-10-16 | Disposition: A | Discharge status: Home / Self Care | Payer: Medicaid Other | Attending: Obstetrics and Gynecology | Admitting: Obstetrics and Gynecology

## 2019-10-16 ENCOUNTER — Encounter (HOSPITAL_COMMUNITY): Payer: Self-pay | Admitting: Obstetrics and Gynecology

## 2019-10-16 ENCOUNTER — Other Ambulatory Visit: Payer: Self-pay

## 2019-10-16 DIAGNOSIS — Z3A27 27 weeks gestation of pregnancy: Secondary | ICD-10-CM | POA: Insufficient documentation

## 2019-10-16 DIAGNOSIS — E0500 Thyrotoxicosis with diffuse goiter without thyrotoxic crisis or storm: Secondary | ICD-10-CM | POA: Insufficient documentation

## 2019-10-16 DIAGNOSIS — Z79899 Other long term (current) drug therapy: Secondary | ICD-10-CM | POA: Diagnosis not present

## 2019-10-16 DIAGNOSIS — E538 Deficiency of other specified B group vitamins: Secondary | ICD-10-CM | POA: Insufficient documentation

## 2019-10-16 DIAGNOSIS — R519 Headache, unspecified: Secondary | ICD-10-CM | POA: Insufficient documentation

## 2019-10-16 DIAGNOSIS — O26892 Other specified pregnancy related conditions, second trimester: Secondary | ICD-10-CM | POA: Diagnosis not present

## 2019-10-16 DIAGNOSIS — O99282 Endocrine, nutritional and metabolic diseases complicating pregnancy, second trimester: Secondary | ICD-10-CM | POA: Insufficient documentation

## 2019-10-16 LAB — PROTEIN / CREATININE RATIO, URINE
Creatinine, Urine: 38.48 mg/dL
Total Protein, Urine: <6 mg/dL

## 2019-10-16 LAB — COMPREHENSIVE METABOLIC PANEL
ALT: 11 U/L (ref 0–44)
AST: 11 U/L — ABNORMAL LOW (ref 15–41)
Albumin: 2.7 g/dL — ABNORMAL LOW (ref 3.5–5.0)
Alkaline Phosphatase: 63 U/L (ref 38–126)
Anion gap: 10 (ref 5–15)
BUN: 6 mg/dL (ref 6–20)
CO2: 22 mmol/L (ref 22–32)
Calcium: 8.4 mg/dL — ABNORMAL LOW (ref 8.9–10.3)
Chloride: 105 mmol/L (ref 98–111)
Creatinine, Ser: 0.55 mg/dL (ref 0.44–1.00)
GFR calc Af Amer: >60 mL/min (ref >60)
GFR calc non Af Amer: >60 mL/min (ref >60)
Glucose, Bld: 74 mg/dL (ref 70–99)
Potassium: 3.7 mmol/L (ref 3.5–5.1)
Sodium: 137 mmol/L (ref 135–145)
Total Bilirubin: <0.1 mg/dL — ABNORMAL LOW (ref 0.3–1.2)
Total Protein: 5.6 g/dL — ABNORMAL LOW (ref 6.5–8.1)

## 2019-10-16 LAB — CBC
HCT: 35.8 % — ABNORMAL LOW (ref 36.0–46.0)
Hemoglobin: 11.7 g/dL — ABNORMAL LOW (ref 12.0–15.0)
MCH: 27.3 pg (ref 26.0–34.0)
MCHC: 32.7 g/dL (ref 30.0–36.0)
MCV: 83.4 fL (ref 80.0–100.0)
Platelets: 234 10*3/uL (ref 150–400)
RBC: 4.29 MIL/uL (ref 3.87–5.11)
RDW: 14 % (ref 11.5–15.5)
WBC: 9.2 10*3/uL (ref 4.0–10.5)
nRBC: 0 % (ref 0.0–0.2)

## 2019-10-16 LAB — URINALYSIS, ROUTINE W REFLEX MICROSCOPIC
Bilirubin Urine: NEGATIVE
Glucose, UA: 50 mg/dL — AB
Hgb urine dipstick: NEGATIVE
Ketones, ur: NEGATIVE mg/dL
Leukocytes,Ua: NEGATIVE
Nitrite: NEGATIVE
Protein, ur: NEGATIVE mg/dL
Specific Gravity, Urine: 1.005 (ref 1.005–1.030)
pH: 6 (ref 5.0–8.0)

## 2019-10-16 LAB — TSH: TSH: <0.01 u[IU]/mL — ABNORMAL LOW (ref 0.350–4.500)

## 2019-10-16 LAB — VITAMIN B12: Vitamin B-12: 122 pg/mL — ABNORMAL LOW (ref 180–914)

## 2019-10-16 LAB — FOLATE: Folate: 7.3 ng/mL (ref >5.9)

## 2019-10-16 MED ORDER — CYANOCOBALAMIN 1000 MCG/ML IJ SOLN
1000.0000 ug | Freq: Once | INTRAMUSCULAR | Status: AC
  Administered 2019-10-16: 1000 ug via INTRAMUSCULAR
  Filled 2019-10-16: qty 1

## 2019-10-16 NOTE — MAU Note (Signed)
Pt reports she has had a headache since last week. No relief from tylenol. Has noticed her vision beng blurry as well.  Noticed some swelling in her anckles as well.Pt has graves  Disease .

## 2019-10-16 NOTE — MAU Provider Note (Signed)
Chief Complaint:  Headache   First Provider Initiated Contact with Patient 10/16/19 1355     HPI: Jennifer Rosales is a 34 y.o. G3P2002 at [redacted]w[redacted]d who presents to maternity admissions reporting headache off and on x1 week, sometimes is relieved with Tylenol but always comes back, dizziness, seeing spots/floaters, itchy/dry eyes, tingling/numbness in her fingers, and intermittent swelling in her legs. Denies vaginal bleeding, leaking of fluid, decreased fetal movement, fever, falls, or recent illness. Also denies epigastric pain, n/v. She is not taking her oral iron supplement. Her last thyroid panel was taken two weeks ago and PTU adjusted at that time.  Pregnancy Course: Normal aside from pregnancy sickness that has subsided  Past Medical History:  Diagnosis Date  . Anemia   . Graves disease   . Graves disease   . Obesity    OB History  Gravida Para Term Preterm AB Living  3 2 2  0 0 2  SAB TAB Ectopic Multiple Live Births  0 0 0   2    # Outcome Date GA Lbr Len/2nd Weight Sex Delivery Anes PTL Lv  3 Current           2 Term 11/12/16 [redacted]w[redacted]d 07:31 / 00:29 6 lb 8.6 oz (2.965 kg) F Vag-Spont EPI  LIV  1 Term 07/30/13 [redacted]w[redacted]d 05:16 / 01:08 7 lb 11.8 oz (3.51 kg) M Vag-Spont EPI  LIV     Birth Comments: WNL   Past Surgical History:  Procedure Laterality Date  . CHOLECYSTECTOMY    . GALLBLADDER SURGERY  2008  . LAPAROSCOPIC GASTRIC SLEEVE RESECTION    . TONSILLECTOMY    . TONSILLECTOMY AND ADENOIDECTOMY    . WISDOM TOOTH EXTRACTION     Family History  Problem Relation Age of Onset  . Hypertension Father   . Cancer Brother   . Diabetes Maternal Grandmother   . Diabetes Paternal Grandmother   . Multiple sclerosis Paternal Grandmother   . Crohn's disease Paternal Grandfather   . Healthy Mother    Social History   Tobacco Use  . Smoking status: Never Smoker  . Smokeless tobacco: Never Used  Vaping Use  . Vaping Use: Never used  Substance Use Topics  . Alcohol use: Not Currently   . Drug use: Not Currently   No Known Allergies Medications Prior to Admission  Medication Sig Dispense Refill Last Dose  . atenolol (TENORMIN) 25 MG tablet Take by mouth as needed.   Past Week at Unknown time  . ferrous sulfate (FERROUSUL) 325 (65 FE) MG tablet Take 1 tablet (325 mg total) by mouth daily with breakfast. 30 tablet 0 10/15/2019 at Unknown time  . Prenatal MV-Min-FA-Omega-3 (PRENATAL GUMMIES/DHA & FA) 0.4-32.5 MG CHEW Chew 1 each by mouth daily.   10/16/2019 at Unknown time  . promethazine (PHENERGAN) 12.5 MG tablet Take 1 tablet (12.5 mg total) by mouth at bedtime as needed for nausea or vomiting. 30 tablet 2 Past Month at Unknown time  . propylthiouracil (PTU) 50 MG tablet Take 100 mg by mouth daily.   10/16/2019 at Unknown time  . venlafaxine (EFFEXOR) 37.5 MG tablet Take 37.5 mg by mouth 2 (two) times daily.   10/15/2019 at Unknown time  . metoCLOPramide (REGLAN) 10 MG tablet Take 1 tablet (10 mg total) by mouth 3 (three) times daily with meals as needed for nausea. (Patient not taking: Reported on 09/15/2019) 30 tablet 2     I have reviewed patient's Past Medical Hx, Surgical Hx, Family Hx, Social Hx,  medications and allergies.   ROS:  Review of Systems  Physical Exam   Patient Vitals for the past 24 hrs:  BP Temp Pulse Resp Height Weight  10/16/19 1321 119/83 98.3 F (36.8 C) (!) 103 18 5\' 6"  (1.676 m) 238 lb (108 kg)    Constitutional: Well-developed, well-nourished female in no acute distress.  Cardiovascular: normal rate & rhythm, no murmur Respiratory: normal effort, lung sounds clear throughout GI: Abd soft, non-tender, gravid appropriate for gestational age. Pos BS x 4 MS: Extremities nontender, no edema, normal ROM Neurologic: Alert and oriented x 4.  GU:  Pelvic exam deferred.   Fetal Tracing: reactive Baseline: 140 Variability: moderate Accelerations: present Decelerations: none Toco: none   Labs: Results for orders placed or performed during the  hospital encounter of 10/16/19 (from the past 24 hour(s))  Urinalysis, Routine w reflex microscopic     Status: Abnormal   Collection Time: 10/16/19  1:24 PM  Result Value Ref Range   Color, Urine STRAW (A) YELLOW   APPearance CLEAR CLEAR   Specific Gravity, Urine 1.005 1.005 - 1.030   pH 6.0 5.0 - 8.0   Glucose, UA 50 (A) NEGATIVE mg/dL   Hgb urine dipstick NEGATIVE NEGATIVE   Bilirubin Urine NEGATIVE NEGATIVE   Ketones, ur NEGATIVE NEGATIVE mg/dL   Protein, ur NEGATIVE NEGATIVE mg/dL   Nitrite NEGATIVE NEGATIVE   Leukocytes,Ua NEGATIVE NEGATIVE  Protein / creatinine ratio, urine     Status: None   Collection Time: 10/16/19  1:24 PM  Result Value Ref Range   Creatinine, Urine 38.48 mg/dL   Total Protein, Urine <6 mg/dL   Protein Creatinine Ratio        0.00 - 0.15 mg/mg[Cre]  CBC     Status: Abnormal   Collection Time: 10/16/19  2:32 PM  Result Value Ref Range   WBC 9.2 4.0 - 10.5 K/uL   RBC 4.29 3.87 - 5.11 MIL/uL   Hemoglobin 11.7 (L) 12.0 - 15.0 g/dL   HCT 10/18/19 (L) 36 - 46 %   MCV 83.4 80.0 - 100.0 fL   MCH 27.3 26.0 - 34.0 pg   MCHC 32.7 30.0 - 36.0 g/dL   RDW 99.8 33.8 - 25.0 %   Platelets 234 150 - 400 K/uL   nRBC 0.0 0.0 - 0.2 %  Comprehensive metabolic panel     Status: Abnormal   Collection Time: 10/16/19  2:32 PM  Result Value Ref Range   Sodium 137 135 - 145 mmol/L   Potassium 3.7 3.5 - 5.1 mmol/L   Chloride 105 98 - 111 mmol/L   CO2 22 22 - 32 mmol/L   Glucose, Bld 74 70 - 99 mg/dL   BUN 6 6 - 20 mg/dL   Creatinine, Ser 10/18/19 0.44 - 1.00 mg/dL   Calcium 8.4 (L) 8.9 - 10.3 mg/dL   Total Protein 5.6 (L) 6.5 - 8.1 g/dL   Albumin 2.7 (L) 3.5 - 5.0 g/dL   AST 11 (L) 15 - 41 U/L   ALT 11 0 - 44 U/L   Alkaline Phosphatase 63 38 - 126 U/L   Total Bilirubin <0.1 (L) 0.3 - 1.2 mg/dL   GFR calc non Af Amer >60 >60 mL/min   GFR calc Af Amer >60 >60 mL/min   Anion gap 10 5 - 15  Vitamin B12     Status: Abnormal   Collection Time: 10/16/19  2:32 PM  Result Value  Ref Range   Vitamin B-12 122 (L) 180 -  914 pg/mL  Folate, serum, performed at [facility] lab     Status: None   Collection Time: 10/16/19  2:32 PM  Result Value Ref Range   Folate 7.3 >5.9 ng/mL  TSH     Status: Abnormal   Collection Time: 10/16/19  2:32 PM  Result Value Ref Range   TSH <0.010 (L) 0.350 - 4.500 uIU/mL    Imaging:  No results found.  MAU Course: Orders Placed This Encounter  Procedures  . Urinalysis, Routine w reflex microscopic  . CBC  . Comprehensive metabolic panel  . Protein / creatinine ratio, urine  . Vitamin B12  . Folate, serum, performed at [facility] lab  . TSH  . Discharge patient   Meds ordered this encounter  Medications  . cyanocobalamin ((VITAMIN B-12)) injection 1,000 mcg    MDM: NST reactive CBC, Pr/cr and folate normal Vitamin B12 abnormal TSH low but comparable to lab drawn 3wks ago (checked Care Everywhere) CMP showed low protein/calcium/albumin - encouraged her to discuss nutrition and absorption with PCP  Consulted with Dr. Alysia Penna about providing a B12 injection in MAU who agreed to plan of care. B12 given and tolerated well.  Assessment: 1. B12 deficiency     Plan: Discharge home in stable condition.     Follow-up Information    Gynecology, [facility]. Go to.   Specialty: Obstetrics and Gynecology Why: as scheduled for prenatal care  Contact information: [address] [place] [address] [phone]               Allergies as of 10/16/2019   No Known Allergies     Medication List    STOP taking these medications   ferrous sulfate 325 (65 FE) MG tablet Commonly known as: FerrouSul   metoCLOPramide 10 MG tablet Commonly known as: REGLAN     TAKE these medications   atenolol 25 MG tablet Commonly known as: TENORMIN Take by mouth as needed.   Prenatal Gummies/DHA & FA 0.4-32.5 MG Chew Chew 1 each by mouth daily.   promethazine 12.5 MG tablet Commonly known as:  PHENERGAN Take 1 tablet (12.5 mg total) by mouth at bedtime as needed for nausea or vomiting.   propylthiouracil 50 MG tablet Commonly known as: PTU Take 100 mg by mouth daily.   venlafaxine 37.5 MG tablet Commonly known as: EFFEXOR Take 37.5 mg by mouth 2 (two) times daily.       Bernerd Limbo, [place] 10/16/2019 4:33 PM

## 2019-10-16 NOTE — Discharge Instructions (Signed)
Please follow-up with your primary care provider or obstetrician regarding your B12 deficiency.  Vitamin B12 Deficiency Vitamin B12 deficiency means that your body does not have enough vitamin B12. The body needs this vitamin:  To make red blood cells.  To make genes (DNA).  To help the nerves work. If you do not have enough vitamin B12 in your body, you can have health problems. What are the causes?  Not eating enough foods that contain vitamin B12.  Not being able to absorb vitamin B12 from the food that you eat.  Certain digestive system diseases.  A condition in which the body does not make enough of a certain protein, which results in too few red blood cells (pernicious anemia).  Having a surgery in which part of the stomach or small intestine is removed.  Taking medicines that make it hard for the body to absorb vitamin B12. These medicines include: ? Heartburn medicines. ? Some antibiotic medicines. ? Other medicines that are used to treat certain conditions. What increases the risk?  Being older than age 73.  Eating a vegetarian or vegan diet, especially while you are pregnant.  Eating a poor diet while you are pregnant.  Taking certain medicines.  Having alcoholism. What are the signs or symptoms? In some cases, there are no symptoms. If the condition leads to too few blood cells or nerve damage, symptoms can occur, such as:  Feeling weak.  Feeling tired (fatigued).  Not being hungry.  Weight loss.  A loss of feeling (numbness) or tingling in your hands and feet.  Redness and burning of the tongue.  Being mixed up (confused) or having memory problems.  Sadness (depression).  Problems with your senses. This can include color blindness, ringing in the ears, or loss of taste.  Watery poop (diarrhea) or trouble pooping (constipation).  Trouble walking. If anemia is very bad, symptoms can include:  Being short of breath.  Being dizzy.  Having a  very fast heartbeat. How is this treated?  Changing the way you eat and drink, such as: ? Eating more foods that contain vitamin B12. ? Drinking little or no alcohol.  Getting vitamin B12 shots.  Taking vitamin B12 supplements. Your doctor will tell you the dose that is best for you. Follow these instructions at home: Eating and drinking   Eat lots of healthy foods that contain vitamin B12. These include: ? Meats and poultry, such as beef, pork, chicken, [place], and organ meats, such as liver. ? Seafood, such as clams, rainbow trout, salmon, tuna, and haddock. ? Eggs. ? Cereal and dairy products that have vitamin B12 added to them. Check the label. The items listed above may not be a complete list of what you can eat and drink. Contact a dietitian for more options. General instructions  Get any shots as told by your doctor.  Take supplements only as told by your doctor.  Do not drink alcohol if your doctor tells you not to. In some cases, you may only be asked to limit alcohol use.  Keep all follow-up visits as told by your doctor. This is important. Contact a doctor if:  Your symptoms come back. Get help right away if:  You have trouble breathing.  You have a very fast heartbeat.  You have chest pain.  You get dizzy.  You pass out. Summary  Vitamin B12 deficiency means that your body is not getting enough vitamin B12.  In some cases, there are no symptoms of this condition.  Treatment may include making a change in the way you eat and drink, getting vitamin B12 shots, or taking supplements.  Eat lots of healthy foods that contain vitamin B12. This information is not intended to replace advice given to you by your health care provider. Make sure you discuss any questions you have with your health care provider. Document Revised: 11/27/2017 Document Reviewed: 11/27/2017 Elsevier Patient Education  2020 [employer].

## 2019-11-04 ENCOUNTER — Telehealth: Payer: Self-pay | Admitting: Genetic Counselor

## 2019-11-04 NOTE — Telephone Encounter (Signed)
I called Ms. Moone to update her about her referral to [facility] Plastic Surgery team. Per Ms. Totherow's request, I placed a referral to [facility] for a prenatal consultation regarding her fetus's cleft lip and palate. Aurea Graff, the referral coordinator for patients with cleft lip and palate, indicated that they like to meet with patients around 35 weeks' gestation. She scheduled Ms. Cervi for an appointment on October 7th at 11:20 am and asked me to call back if this date/time does not work for the patient. I informed Ms. Malen Gauze of the appointment date, time, and address [facility] Plastic Surgery office, 5th floor of [address]). I requested that Ms. Patnode call or email me back to confirm receipt of this message and to let me know if that appointment works for her.  Gershon Crane, MS, [facility] Genetic Counselor

## 2019-11-11 ENCOUNTER — Ambulatory Visit: Payer: Medicaid Other | Attending: Obstetrics and Gynecology

## 2019-11-11 ENCOUNTER — Encounter: Payer: Self-pay | Admitting: *Deleted

## 2019-11-11 ENCOUNTER — Other Ambulatory Visit: Payer: Self-pay

## 2019-11-11 ENCOUNTER — Ambulatory Visit: Payer: Medicaid Other | Admitting: *Deleted

## 2019-11-11 VITALS — BP 119/83 | HR 86

## 2019-11-11 DIAGNOSIS — O99283 Endocrine, nutritional and metabolic diseases complicating pregnancy, third trimester: Secondary | ICD-10-CM | POA: Diagnosis not present

## 2019-11-11 DIAGNOSIS — O358XX0 Maternal care for other (suspected) fetal abnormality and damage, not applicable or unspecified: Secondary | ICD-10-CM | POA: Diagnosis not present

## 2019-11-11 DIAGNOSIS — E0590 Thyrotoxicosis, unspecified without thyrotoxic crisis or storm: Secondary | ICD-10-CM

## 2019-11-11 DIAGNOSIS — Z3A30 30 weeks gestation of pregnancy: Secondary | ICD-10-CM | POA: Diagnosis not present

## 2019-11-11 DIAGNOSIS — O35AXX0 Maternal care for other (suspected) fetal abnormality and damage, fetal facial anomalies, not applicable or unspecified: Secondary | ICD-10-CM

## 2019-11-11 DIAGNOSIS — O359XX0 Maternal care for (suspected) fetal abnormality and damage, unspecified, not applicable or unspecified: Secondary | ICD-10-CM

## 2019-11-12 ENCOUNTER — Other Ambulatory Visit: Payer: Self-pay | Admitting: *Deleted

## 2019-11-12 DIAGNOSIS — O283 Abnormal ultrasonic finding on antenatal screening of mother: Secondary | ICD-10-CM

## 2019-12-04 DIAGNOSIS — E0590 Thyrotoxicosis, unspecified without thyrotoxic crisis or storm: Secondary | ICD-10-CM | POA: Diagnosis not present

## 2019-12-09 DIAGNOSIS — O99213 Obesity complicating pregnancy, third trimester: Secondary | ICD-10-CM | POA: Diagnosis not present

## 2019-12-09 DIAGNOSIS — Z20828 Contact with and (suspected) exposure to other viral communicable diseases: Secondary | ICD-10-CM | POA: Diagnosis not present

## 2019-12-09 DIAGNOSIS — Z3483 Encounter for supervision of other normal pregnancy, third trimester: Secondary | ICD-10-CM | POA: Diagnosis not present

## 2019-12-09 DIAGNOSIS — O358XX0 Maternal care for other (suspected) fetal abnormality and damage, not applicable or unspecified: Secondary | ICD-10-CM | POA: Diagnosis not present

## 2019-12-09 DIAGNOSIS — E0590 Thyrotoxicosis, unspecified without thyrotoxic crisis or storm: Secondary | ICD-10-CM | POA: Diagnosis not present

## 2019-12-12 ENCOUNTER — Ambulatory Visit: Payer: Medicaid Other | Admitting: *Deleted

## 2019-12-12 ENCOUNTER — Other Ambulatory Visit: Payer: Self-pay

## 2019-12-12 ENCOUNTER — Ambulatory Visit: Payer: Medicaid Other | Attending: Obstetrics and Gynecology

## 2019-12-12 ENCOUNTER — Encounter: Payer: Self-pay | Admitting: *Deleted

## 2019-12-12 VITALS — BP 118/86 | HR 88

## 2019-12-12 DIAGNOSIS — O99280 Endocrine, nutritional and metabolic diseases complicating pregnancy, unspecified trimester: Secondary | ICD-10-CM

## 2019-12-12 DIAGNOSIS — O358XX0 Maternal care for other (suspected) fetal abnormality and damage, not applicable or unspecified: Secondary | ICD-10-CM | POA: Diagnosis not present

## 2019-12-12 DIAGNOSIS — O283 Abnormal ultrasonic finding on antenatal screening of mother: Secondary | ICD-10-CM

## 2019-12-12 DIAGNOSIS — Z3483 Encounter for supervision of other normal pregnancy, third trimester: Secondary | ICD-10-CM | POA: Diagnosis not present

## 2019-12-12 DIAGNOSIS — Z362 Encounter for other antenatal screening follow-up: Secondary | ICD-10-CM

## 2019-12-12 DIAGNOSIS — O479 False labor, unspecified: Secondary | ICD-10-CM | POA: Diagnosis not present

## 2019-12-12 DIAGNOSIS — Z3A35 35 weeks gestation of pregnancy: Secondary | ICD-10-CM | POA: Diagnosis not present

## 2019-12-12 DIAGNOSIS — E0590 Thyrotoxicosis, unspecified without thyrotoxic crisis or storm: Secondary | ICD-10-CM | POA: Diagnosis not present

## 2019-12-12 DIAGNOSIS — E039 Hypothyroidism, unspecified: Secondary | ICD-10-CM

## 2019-12-12 DIAGNOSIS — O359XX0 Maternal care for (suspected) fetal abnormality and damage, unspecified, not applicable or unspecified: Secondary | ICD-10-CM

## 2019-12-12 DIAGNOSIS — O35AXX0 Maternal care for other (suspected) fetal abnormality and damage, fetal facial anomalies, not applicable or unspecified: Secondary | ICD-10-CM

## 2019-12-15 DIAGNOSIS — O358XX0 Maternal care for other (suspected) fetal abnormality and damage, not applicable or unspecified: Secondary | ICD-10-CM | POA: Diagnosis not present

## 2019-12-15 DIAGNOSIS — O99213 Obesity complicating pregnancy, third trimester: Secondary | ICD-10-CM | POA: Diagnosis not present

## 2019-12-15 DIAGNOSIS — R030 Elevated blood-pressure reading, without diagnosis of hypertension: Secondary | ICD-10-CM | POA: Diagnosis not present

## 2019-12-15 DIAGNOSIS — Z3483 Encounter for supervision of other normal pregnancy, third trimester: Secondary | ICD-10-CM | POA: Diagnosis not present

## 2019-12-15 DIAGNOSIS — E0590 Thyrotoxicosis, unspecified without thyrotoxic crisis or storm: Secondary | ICD-10-CM | POA: Diagnosis not present

## 2019-12-18 DIAGNOSIS — E0590 Thyrotoxicosis, unspecified without thyrotoxic crisis or storm: Secondary | ICD-10-CM | POA: Diagnosis not present

## 2019-12-22 DIAGNOSIS — O99213 Obesity complicating pregnancy, third trimester: Secondary | ICD-10-CM | POA: Diagnosis not present

## 2019-12-22 DIAGNOSIS — O358XX0 Maternal care for other (suspected) fetal abnormality and damage, not applicable or unspecified: Secondary | ICD-10-CM | POA: Diagnosis not present

## 2019-12-22 DIAGNOSIS — Z3490 Encounter for supervision of normal pregnancy, unspecified, unspecified trimester: Secondary | ICD-10-CM | POA: Diagnosis not present

## 2019-12-22 DIAGNOSIS — E0590 Thyrotoxicosis, unspecified without thyrotoxic crisis or storm: Secondary | ICD-10-CM | POA: Diagnosis not present

## 2019-12-22 LAB — OB RESULTS CONSOLE GBS: GBS: NEGATIVE

## 2019-12-25 DIAGNOSIS — O358XX0 Maternal care for other (suspected) fetal abnormality and damage, not applicable or unspecified: Secondary | ICD-10-CM | POA: Diagnosis not present

## 2019-12-25 DIAGNOSIS — Z3A37 37 weeks gestation of pregnancy: Secondary | ICD-10-CM | POA: Diagnosis not present

## 2019-12-25 DIAGNOSIS — O99213 Obesity complicating pregnancy, third trimester: Secondary | ICD-10-CM | POA: Diagnosis not present

## 2019-12-28 ENCOUNTER — Encounter (HOSPITAL_COMMUNITY): Payer: Self-pay | Admitting: Obstetrics and Gynecology

## 2019-12-28 ENCOUNTER — Other Ambulatory Visit: Payer: Self-pay

## 2019-12-28 ENCOUNTER — Inpatient Hospital Stay (HOSPITAL_COMMUNITY)
Admission: AD | Admit: 2019-12-28 | Discharge: 2019-12-28 | Disposition: A | Discharge status: Home / Self Care | Payer: Medicaid Other | Attending: Obstetrics and Gynecology | Admitting: Obstetrics and Gynecology

## 2019-12-28 DIAGNOSIS — Z3A37 37 weeks gestation of pregnancy: Secondary | ICD-10-CM | POA: Diagnosis not present

## 2019-12-28 DIAGNOSIS — O471 False labor at or after 37 completed weeks of gestation: Secondary | ICD-10-CM | POA: Diagnosis not present

## 2019-12-28 NOTE — MAU Note (Signed)
Pt reports she is feeling the baby moving now and has been since she arrived. Pt reports being concerned with having a fast delivery like her last child.

## 2019-12-28 NOTE — MAU Note (Signed)
..  Jennifer Rosales is a 34 y.o. at [redacted]w[redacted]d here in MAU reporting: CTX "unsure of how often." Pt reports she last remembers feeling baby move around 1000. Denies VB. Pt reports she had a greenish/white mucousy discharge, denies gushes of fluid only "dribbles."  Onset of complaint: 1630 Pain score: 8/10 Vitals:   12/28/19 1922 12/28/19 1926  BP:  127/78  Pulse:  84  Resp:  17  Temp: 97.7 F (36.5 C)   SpO2: 100%      FHT: doppler 158

## 2019-12-28 NOTE — Discharge Instructions (Signed)

## 2019-12-28 NOTE — MAU Provider Note (Signed)
S: Ms. Jennifer Rosales is a 35 y.o. G3P2002 at [redacted]w[redacted]d  who presents to MAU today complaining contractions q 10 minutes since 1800. She denies vaginal bleeding. She denies LOF. She reports normal fetal movement.    O: BP 118/81   Pulse 92   Temp 97.7 F (36.5 C) (Oral)   Resp 17   Ht 5\' 6"  (1.676 m)   Wt 111 kg   LMP 04/09/2019   SpO2 98%   BMI 39.51 kg/m  GENERAL: Well-developed, well-nourished female in no acute distress.  HEAD: Normocephalic, atraumatic.  CHEST: Normal effort of breathing, regular heart rate ABDOMEN: Soft, nontender, gravid  Cervical exam:  Dilation: 2 Effacement (%): 10 Cervical Position: Posterior Exam by:: [email or web address], RN   Fetal Monitoring: Baseline: 135 Variability: moderate Accelerations: 15x15 Decelerations: none Contractions: 5-15  Patient observed and rechecked after 2 hours with no change.   A: SIUP at [redacted]w[redacted]d  False labor  P: -Discharge home in stable condition -Labor precautions discussed -Patient advised to follow-up with OB as scheduled for prenatal care -Patient may return to MAU as needed or if her condition were to change or worsen   [redacted]w[redacted]d, [name] 12/28/2019 9:36 PM

## 2019-12-29 DIAGNOSIS — O99213 Obesity complicating pregnancy, third trimester: Secondary | ICD-10-CM | POA: Diagnosis not present

## 2019-12-29 DIAGNOSIS — E0590 Thyrotoxicosis, unspecified without thyrotoxic crisis or storm: Secondary | ICD-10-CM | POA: Diagnosis not present

## 2019-12-29 DIAGNOSIS — O358XX0 Maternal care for other (suspected) fetal abnormality and damage, not applicable or unspecified: Secondary | ICD-10-CM | POA: Diagnosis not present

## 2020-01-01 DIAGNOSIS — E0590 Thyrotoxicosis, unspecified without thyrotoxic crisis or storm: Secondary | ICD-10-CM | POA: Diagnosis not present

## 2020-01-01 DIAGNOSIS — Z3480 Encounter for supervision of other normal pregnancy, unspecified trimester: Secondary | ICD-10-CM | POA: Diagnosis not present

## 2020-01-02 ENCOUNTER — Encounter (HOSPITAL_COMMUNITY): Payer: Self-pay | Admitting: *Deleted

## 2020-01-02 ENCOUNTER — Telehealth (HOSPITAL_COMMUNITY): Payer: Self-pay | Admitting: *Deleted

## 2020-01-02 NOTE — Telephone Encounter (Signed)
Preadmission screen  

## 2020-01-05 DIAGNOSIS — O99213 Obesity complicating pregnancy, third trimester: Secondary | ICD-10-CM | POA: Diagnosis not present

## 2020-01-05 DIAGNOSIS — E0590 Thyrotoxicosis, unspecified without thyrotoxic crisis or storm: Secondary | ICD-10-CM | POA: Diagnosis not present

## 2020-01-05 DIAGNOSIS — O358XX0 Maternal care for other (suspected) fetal abnormality and damage, not applicable or unspecified: Secondary | ICD-10-CM | POA: Diagnosis not present

## 2020-01-06 ENCOUNTER — Other Ambulatory Visit (HOSPITAL_COMMUNITY)
Admission: RE | Admit: 2020-01-06 | Discharge: 2020-01-06 | Disposition: A | Discharge status: Home / Self Care | Payer: Medicaid Other | Source: Ambulatory Visit | Attending: Obstetrics and Gynecology | Admitting: Obstetrics and Gynecology

## 2020-01-06 ENCOUNTER — Other Ambulatory Visit (HOSPITAL_COMMUNITY): Payer: Medicaid Other

## 2020-01-06 DIAGNOSIS — Z20822 Contact with and (suspected) exposure to covid-19: Secondary | ICD-10-CM | POA: Diagnosis not present

## 2020-01-06 DIAGNOSIS — Z01812 Encounter for preprocedural laboratory examination: Secondary | ICD-10-CM | POA: Diagnosis not present

## 2020-01-06 LAB — SARS CORONAVIRUS 2 (TAT 6-24 HRS): SARS Coronavirus 2: NEGATIVE

## 2020-01-07 ENCOUNTER — Other Ambulatory Visit: Payer: Self-pay | Admitting: Obstetrics and Gynecology

## 2020-01-07 NOTE — H&P (Deleted)
  The note originally documented on this encounter has been moved the the encounter in which it belongs.  

## 2020-01-07 NOTE — H&P (Signed)
Jennifer Rosales is a 34 y.o. female  G3P2002 at 21 wks and 1 day presenting for induction of labor due to hyperthyroidism ( graves disease)  prengnacy has also been complicated by fetal cleft lip/ palate and morbid obesity. . OB History    Gravida  3   Para  2   Term  2   Preterm  0   AB  0   Living  2     SAB  0   TAB  0   Ectopic  0   Multiple      Live Births  2          Past Medical History:  Diagnosis Date  . Anemia   . Graves disease   . Graves disease   . Obesity    Past Surgical History:  Procedure Laterality Date  . CHOLECYSTECTOMY    . GALLBLADDER SURGERY  2008  . LAPAROSCOPIC GASTRIC SLEEVE RESECTION    . TONSILLECTOMY    . TONSILLECTOMY AND ADENOIDECTOMY    . WISDOM TOOTH EXTRACTION     Family History: family history includes Cancer in her brother; Crohn's disease in her paternal grandfather; Diabetes in her maternal grandmother and paternal grandmother; Healthy in her mother; Hypertension in her father; Multiple sclerosis in her paternal grandmother. Social History:  reports that she has never smoked. She has never used smokeless tobacco. She reports previous alcohol use. She reports previous drug use.     Maternal Diabetes: No Genetic Screening: Normal Maternal Ultrasounds/Referrals: Other: Fetal Ultrasounds or other Referrals:  Referred to Materal Fetal Medicine  Maternal Substance Abuse:  No Significant Maternal Medications:  Meds include: Other:  Significant Maternal Lab Results:  Group B Strep negative Other Comments:  fetal cleft lip/ palate   Review of Systems  Constitutional: Negative.   HENT: Negative.   Eyes: Negative.   Respiratory: Negative.   Cardiovascular: Negative.   Gastrointestinal: Negative.   Endocrine: Negative.   Genitourinary: Negative.   Musculoskeletal: Negative.   Skin: Negative.   Allergic/Immunologic: Negative.   Neurological: Negative.   Hematological: Negative.   Psychiatric/Behavioral: Negative.     History   Last menstrual period 04/09/2019, unknown if currently breastfeeding. Maternal Exam:  Introitus: Normal vulva.   Physical Exam Vitals reviewed.  Constitutional:      Appearance: Normal appearance.  HENT:     Nose: Nose normal.  Eyes:     Pupils: Pupils are equal, round, and reactive to light.  Cardiovascular:     Rate and Rhythm: Normal rate and regular rhythm.     Pulses: Normal pulses.     Heart sounds: Normal heart sounds.  Pulmonary:     Effort: Pulmonary effort is normal.     Breath sounds: Normal breath sounds.  Genitourinary:    General: Normal vulva.  Musculoskeletal:        General: Swelling present. Normal range of motion.     Cervical back: Normal range of motion and neck supple.  Skin:    General: Skin is warm and dry.  Neurological:     General: No focal deficit present.     Mental Status: She is alert and oriented to person, place, and time.  Psychiatric:        Mood and Affect: Mood normal.        Behavior: Behavior normal.     Prenatal labs: ABO, Rh:   A positive  Antibody:   Negative  Rubella:  Immune  RPR:   Nonreactive  HBsAg:  Negative HIV:   Negative  GBS:   Negative   Assessment/Plan: 39 wks and 1 day with hyperthyroidism for induction  cytotec for cervical ripening  - hyperthyroidism currently euthyroid  - fetal cleft lip / palate- pediatric plastic surgery arranged postpartum  - anticipate SVD    Gerald Leitz 01/07/2020, 6:06 PM

## 2020-01-08 ENCOUNTER — Other Ambulatory Visit: Payer: Self-pay

## 2020-01-08 ENCOUNTER — Inpatient Hospital Stay (HOSPITAL_COMMUNITY): Payer: Medicaid Other

## 2020-01-08 ENCOUNTER — Encounter (HOSPITAL_COMMUNITY): Payer: Self-pay | Admitting: Obstetrics and Gynecology

## 2020-01-08 ENCOUNTER — Inpatient Hospital Stay (HOSPITAL_COMMUNITY): Payer: Medicaid Other | Admitting: Anesthesiology

## 2020-01-08 ENCOUNTER — Inpatient Hospital Stay (HOSPITAL_COMMUNITY)
Admission: AD | Admit: 2020-01-08 | Discharge: 2020-01-10 | DRG: 807 | Disposition: A | Discharge status: Home / Self Care | Payer: Medicaid Other | Attending: Obstetrics and Gynecology | Admitting: Obstetrics and Gynecology

## 2020-01-08 DIAGNOSIS — O99284 Endocrine, nutritional and metabolic diseases complicating childbirth: Secondary | ICD-10-CM | POA: Diagnosis not present

## 2020-01-08 DIAGNOSIS — Z3A39 39 weeks gestation of pregnancy: Secondary | ICD-10-CM | POA: Diagnosis not present

## 2020-01-08 DIAGNOSIS — O99283 Endocrine, nutritional and metabolic diseases complicating pregnancy, third trimester: Secondary | ICD-10-CM | POA: Diagnosis present

## 2020-01-08 DIAGNOSIS — O99214 Obesity complicating childbirth: Secondary | ICD-10-CM | POA: Diagnosis present

## 2020-01-08 DIAGNOSIS — O9902 Anemia complicating childbirth: Secondary | ICD-10-CM | POA: Diagnosis not present

## 2020-01-08 DIAGNOSIS — E0590 Thyrotoxicosis, unspecified without thyrotoxic crisis or storm: Secondary | ICD-10-CM | POA: Diagnosis present

## 2020-01-08 DIAGNOSIS — D649 Anemia, unspecified: Secondary | ICD-10-CM | POA: Diagnosis not present

## 2020-01-08 DIAGNOSIS — O358XX0 Maternal care for other (suspected) fetal abnormality and damage, not applicable or unspecified: Secondary | ICD-10-CM | POA: Diagnosis present

## 2020-01-08 DIAGNOSIS — E0500 Thyrotoxicosis with diffuse goiter without thyrotoxic crisis or storm: Secondary | ICD-10-CM | POA: Diagnosis not present

## 2020-01-08 LAB — RPR: RPR Ser Ql: NONREACTIVE

## 2020-01-08 LAB — TYPE AND SCREEN
ABO/RH(D): A POS
Antibody Screen: NEGATIVE

## 2020-01-08 LAB — CBC
HCT: 31.9 % — ABNORMAL LOW (ref 36.0–46.0)
Hemoglobin: 10.1 g/dL — ABNORMAL LOW (ref 12.0–15.0)
MCH: 25.2 pg — ABNORMAL LOW (ref 26.0–34.0)
MCHC: 31.7 g/dL (ref 30.0–36.0)
MCV: 79.6 fL — ABNORMAL LOW (ref 80.0–100.0)
Platelets: 213 10*3/uL (ref 150–400)
RBC: 4.01 MIL/uL (ref 3.87–5.11)
RDW: 14.4 % (ref 11.5–15.5)
WBC: 9.8 10*3/uL (ref 4.0–10.5)
nRBC: 0 % (ref 0.0–0.2)

## 2020-01-08 MED ORDER — LACTATED RINGERS IV SOLN
500.0000 mL | Freq: Once | INTRAVENOUS | Status: AC
  Administered 2020-01-08: 500 mL via INTRAVENOUS

## 2020-01-08 MED ORDER — WITCH HAZEL-GLYCERIN EX PADS: 1.0000 "application " | MEDICATED_PAD | CUTANEOUS | Status: DC | PRN

## 2020-01-08 MED ORDER — LACTATED RINGERS IV SOLN: INTRAVENOUS | Status: DC

## 2020-01-08 MED ORDER — EPHEDRINE 5 MG/ML INJ: 10.0000 mg | INTRAVENOUS | Status: DC | PRN

## 2020-01-08 MED ORDER — MISOPROSTOL 25 MCG QUARTER TABLET
25.0000 ug | ORAL_TABLET | ORAL | Status: DC | PRN
  Administered 2020-01-08 (×2): 25 ug via VAGINAL
  Filled 2020-01-08 (×2): qty 1

## 2020-01-08 MED ORDER — ZOLPIDEM TARTRATE 5 MG PO TABS: 5.0000 mg | ORAL_TABLET | Freq: Every evening | ORAL | Status: DC | PRN

## 2020-01-08 MED ORDER — ONDANSETRON HCL 4 MG/2ML IJ SOLN: 4.0000 mg | Freq: Four times a day (QID) | INTRAMUSCULAR | Status: DC | PRN

## 2020-01-08 MED ORDER — PRENATAL MULTIVITAMIN CH
1.0000 | ORAL_TABLET | Freq: Every day | ORAL | Status: DC
  Administered 2020-01-09 – 2020-01-10 (×2): 1 via ORAL
  Filled 2020-01-08 (×2): qty 1

## 2020-01-08 MED ORDER — LIDOCAINE HCL (PF) 1 % IJ SOLN: 30.0000 mL | INTRAMUSCULAR | Status: DC | PRN

## 2020-01-08 MED ORDER — DIBUCAINE (PERIANAL) 1 % EX OINT: 1.0000 "application " | TOPICAL_OINTMENT | CUTANEOUS | Status: DC | PRN

## 2020-01-08 MED ORDER — OXYTOCIN-SODIUM CHLORIDE 30-0.9 UT/500ML-% IV SOLN
1.0000 m[IU]/min | INTRAVENOUS | Status: DC
  Administered 2020-01-08: 2 m[IU]/min via INTRAVENOUS
  Filled 2020-01-08: qty 500

## 2020-01-08 MED ORDER — LIDOCAINE HCL (PF) 1 % IJ SOLN
INTRAMUSCULAR | Status: DC | PRN
  Administered 2020-01-08 (×2): 4 mL via EPIDURAL

## 2020-01-08 MED ORDER — BENZOCAINE-MENTHOL 20-0.5 % EX AERO
1.0000 "application " | INHALATION_SPRAY | CUTANEOUS | Status: DC | PRN
  Filled 2020-01-08: qty 56

## 2020-01-08 MED ORDER — FENTANYL CITRATE (PF) 2500 MCG/50ML IJ SOLN
INTRAMUSCULAR | Status: DC | PRN
Start: 2020-01-08 — End: 2020-01-08
  Administered 2020-01-08: 12 mL/h via EPIDURAL

## 2020-01-08 MED ORDER — OXYTOCIN BOLUS FROM INFUSION
333.0000 mL | Freq: Once | INTRAVENOUS | Status: AC
  Administered 2020-01-08: 333 mL via INTRAVENOUS

## 2020-01-08 MED ORDER — SENNOSIDES-DOCUSATE SODIUM 8.6-50 MG PO TABS
2.0000 | ORAL_TABLET | ORAL | Status: DC
  Administered 2020-01-08 – 2020-01-10 (×2): 2 via ORAL
  Filled 2020-01-08 (×2): qty 2

## 2020-01-08 MED ORDER — FENTANYL CITRATE (PF) 100 MCG/2ML IJ SOLN: 50.0000 ug | INTRAMUSCULAR | Status: DC | PRN

## 2020-01-08 MED ORDER — HYDROXYZINE HCL 50 MG PO TABS: 50.0000 mg | ORAL_TABLET | Freq: Four times a day (QID) | ORAL | Status: DC | PRN

## 2020-01-08 MED ORDER — ACETAMINOPHEN 325 MG PO TABS: 650.0000 mg | ORAL_TABLET | ORAL | Status: DC | PRN

## 2020-01-08 MED ORDER — PHENYLEPHRINE 40 MCG/ML (10ML) SYRINGE FOR IV PUSH (FOR BLOOD PRESSURE SUPPORT): 80.0000 ug | PREFILLED_SYRINGE | INTRAVENOUS | Status: DC | PRN

## 2020-01-08 MED ORDER — OXYCODONE-ACETAMINOPHEN 5-325 MG PO TABS: 1.0000 | ORAL_TABLET | ORAL | Status: DC | PRN

## 2020-01-08 MED ORDER — TERBUTALINE SULFATE 1 MG/ML IJ SOLN: 0.2500 mg | Freq: Once | INTRAMUSCULAR | Status: DC | PRN

## 2020-01-08 MED ORDER — METHYLERGONOVINE MALEATE 0.2 MG PO TABS: 0.2000 mg | ORAL_TABLET | ORAL | Status: DC | PRN

## 2020-01-08 MED ORDER — SOD CITRATE-CITRIC ACID 500-334 MG/5ML PO SOLN: 30.0000 mL | ORAL | Status: DC | PRN

## 2020-01-08 MED ORDER — DIPHENHYDRAMINE HCL 50 MG/ML IJ SOLN: 12.5000 mg | INTRAMUSCULAR | Status: DC | PRN

## 2020-01-08 MED ORDER — LACTATED RINGERS IV SOLN: 500.0000 mL | INTRAVENOUS | Status: DC | PRN

## 2020-01-08 MED ORDER — LACTATED RINGERS AMNIOINFUSION
INTRAVENOUS | Status: DC
  Administered 2020-01-08: 1000 mL via INTRAUTERINE

## 2020-01-08 MED ORDER — SIMETHICONE 80 MG PO CHEW: 80.0000 mg | CHEWABLE_TABLET | ORAL | Status: DC | PRN

## 2020-01-08 MED ORDER — COCONUT OIL OIL: 1.0000 "application " | TOPICAL_OIL | Status: DC | PRN

## 2020-01-08 MED ORDER — ACETAMINOPHEN 325 MG PO TABS
650.0000 mg | ORAL_TABLET | ORAL | Status: DC | PRN
  Administered 2020-01-09: 650 mg via ORAL
  Filled 2020-01-08: qty 2

## 2020-01-08 MED ORDER — METHYLERGONOVINE MALEATE 0.2 MG/ML IJ SOLN: 0.2000 mg | INTRAMUSCULAR | Status: DC | PRN

## 2020-01-08 MED ORDER — OXYTOCIN-SODIUM CHLORIDE 30-0.9 UT/500ML-% IV SOLN: 2.5000 [IU]/h | INTRAVENOUS | Status: DC

## 2020-01-08 MED ORDER — ONDANSETRON HCL 4 MG PO TABS: 4.0000 mg | ORAL_TABLET | ORAL | Status: DC | PRN

## 2020-01-08 MED ORDER — OXYCODONE-ACETAMINOPHEN 5-325 MG PO TABS: 2.0000 | ORAL_TABLET | ORAL | Status: DC | PRN

## 2020-01-08 MED ORDER — IBUPROFEN 600 MG PO TABS
600.0000 mg | ORAL_TABLET | Freq: Four times a day (QID) | ORAL | Status: DC
  Administered 2020-01-08 – 2020-01-10 (×7): 600 mg via ORAL
  Filled 2020-01-08 (×7): qty 1

## 2020-01-08 MED ORDER — DIPHENHYDRAMINE HCL 25 MG PO CAPS: 25.0000 mg | ORAL_CAPSULE | Freq: Four times a day (QID) | ORAL | Status: DC | PRN

## 2020-01-08 MED ORDER — ONDANSETRON HCL 4 MG/2ML IJ SOLN: 4.0000 mg | INTRAMUSCULAR | Status: DC | PRN

## 2020-01-08 MED ORDER — FENTANYL-BUPIVACAINE-NACL 0.5-0.125-0.9 MG/250ML-% EP SOLN
12.0000 mL/h | EPIDURAL | Status: DC | PRN
  Filled 2020-01-08: qty 250

## 2020-01-08 NOTE — Anesthesia Procedure Notes (Signed)
Epidural Patient location during procedure: OB Start time: 01/08/2020 9:47 AM End time: 01/08/2020 9:55 AM  Staffing Anesthesiologist: Mal Amabile, MD Performed: anesthesiologist   Preanesthetic Checklist Completed: patient identified, IV checked, site marked, risks and benefits discussed, surgical consent, monitors and equipment checked, pre-op evaluation and timeout performed  Epidural Patient position: sitting Prep: DuraPrep and site prepped and draped Patient monitoring: continuous pulse ox and blood pressure Approach: midline Location: L3-L4 Injection technique: LOR air  Needle:  Needle type: Tuohy  Needle gauge: 17 G Needle length: 9 cm and 9 Needle insertion depth: 7 cm Catheter type: closed end flexible Catheter size: 19 Gauge Catheter at skin depth: 12 cm Test dose: negative and Other  Assessment Events: blood not aspirated, injection not painful, no injection resistance, no paresthesia and negative IV test  Additional Notes Patient identified. Risks and benefits discussed including failed block, incomplete  Pain control, post dural puncture headache, nerve damage, paralysis, blood pressure Changes, nausea, vomiting, reactions to medications-both toxic and allergic and post Partum back pain. All questions were answered. Patient expressed understanding and wished to proceed. Sterile technique was used throughout procedure. Epidural site was Dressed with sterile barrier dressing. No paresthesias, signs of intravascular injection Or signs of intrathecal spread were encountered.  Patient was more comfortable after the epidural was dosed. Please see RN's note for documentation of vital signs and FHR which are stable. Reason for block:procedure for pain

## 2020-01-08 NOTE — Anesthesia Preprocedure Evaluation (Signed)
Anesthesia Evaluation  Patient identified by MRN, date of birth, ID band Patient awake    Reviewed: Allergy & Precautions, H&P , Patient's Chart, lab work & pertinent test results  Airway Mallampati: II  TM Distance: >3 FB Neck ROM: full    Dental no notable dental hx. (+) Teeth Intact   Pulmonary neg pulmonary ROS,    Pulmonary exam normal breath sounds clear to auscultation       Cardiovascular negative cardio ROS Normal cardiovascular exam Rhythm:regular Rate:Normal     Neuro/Psych negative neurological ROS  negative psych ROS   GI/Hepatic negative GI ROS, Neg liver ROS, GERD  ,  Endo/Other  Morbid obesity  Renal/GU negative Renal ROS  negative genitourinary   Musculoskeletal negative musculoskeletal ROS (+)   Abdominal   Peds  Hematology  (+) Blood dyscrasia, anemia ,   Anesthesia Other Findings   Reproductive/Obstetrics (+) Pregnancy                             Anesthesia Physical Anesthesia Plan  ASA: II  Anesthesia Plan: Epidural   Post-op Pain Management:    Induction:   PONV Risk Score and Plan:   Airway Management Planned:   Additional Equipment:   Intra-op Plan:   Post-operative Plan:   Informed Consent: I have reviewed the patients History and Physical, chart, labs and discussed the procedure including the risks, benefits and alternatives for the proposed anesthesia with the patient or authorized representative who has indicated his/her understanding and acceptance.       Plan Discussed with: Anesthesiologist  Anesthesia Plan Comments:         Anesthesia Quick Evaluation

## 2020-01-08 NOTE — Progress Notes (Signed)
   Subjective: Patient is comfortable with her epidural .. no lof no vaginal bleeding +FM  Objective: BP 108/82   Pulse 67   Temp 98.4 F (36.9 C) (Oral)   Resp 16   Ht 5\' 6"  (1.676 m)   Wt 111.4 kg   LMP 04/09/2019   BMI 39.62 kg/m  I/O last 3 completed shifts: In: 662.2 [I.V.:662.2] Out: -  Total I/O In: -  Out: 625 [Urine:625]  FHT:  FHR: 125 bpm, variability: moderate,  accelerations:  Present,  decelerations:  Absent UC:   regular, every 2-3 minutes SVE:   Dilation: 3 Effacement (%): 70 Station: -3 Exam by:: A Showfety RN  Labs: Lab Results  Component Value Date   WBC 9.8 01/08/2020   HGB 10.1 (L) 01/08/2020   HCT 31.9 (L) 01/08/2020   MCV 79.6 (L) 01/08/2020   PLT 213 01/08/2020    Assessment / Plan: Induction of labor due to hyperthyroidism,  progressing well on pitocin  Labor: Progressing normally Preeclampsia:  NA Fetal Wellbeing:  Category I Pain Control:  Epidural I/D:  n/a Anticipated MOD:  NSVD  03/09/2020 01/08/2020, 1:28 PM

## 2020-01-08 NOTE — Lactation Note (Signed)
This note was copied from a baby's chart. Lactation Consultation Note  Patient Name: Jennifer Rosales QPRFF'M Date: 01/08/2020 Moms third baby.  Baby Jennifer Jennifer Rosales now 5 hours old.  Mom with hx hyperthyroidism, Graves Disease, and depression.  Mom breastfed her first two babies for 2 months with first and three months with second.  Mom reports positive breast changes with pregnancy.  Mom has two DEBP for home use.  Her Medela pump she used last time and her new Freemie pump. Discussed using hospital pump to establish milk supply. Mom has not yet started pumping.  Mom reports she would like to rest first.   RN reports she will initiate pumping with mom.  Jennifer Rosales currently with cleft lip and palate. Mom reports she plans to pump and bottle feed.  Did not discuss STS or putting Jennifer Rosales to the breast at this visit.  Parents report [name] is going to the nursery for a little while.  Mom reports she hasn't slept in a few days.   Reviewed and left [facility] handout.  Urged parents to watch [employer] hand expression Video Hands on pumping.  Call lactation as needed.   Maternal Data Formula Feeding for Exclusion: No  Feeding    LATCH Score                   Interventions    Lactation Tools Discussed/Used     Consult Status      Jennifer Rosales 01/08/2020, 10:52 PM

## 2020-01-09 LAB — CBC
HCT: 31.2 % — ABNORMAL LOW (ref 36.0–46.0)
Hemoglobin: 9.9 g/dL — ABNORMAL LOW (ref 12.0–15.0)
MCH: 25.3 pg — ABNORMAL LOW (ref 26.0–34.0)
MCHC: 31.7 g/dL (ref 30.0–36.0)
MCV: 79.8 fL — ABNORMAL LOW (ref 80.0–100.0)
Platelets: 199 10*3/uL (ref 150–400)
RBC: 3.91 MIL/uL (ref 3.87–5.11)
RDW: 14.6 % (ref 11.5–15.5)
WBC: 8.3 10*3/uL (ref 4.0–10.5)
nRBC: 0 % (ref 0.0–0.2)

## 2020-01-09 NOTE — Clinical Social Work Maternal (Signed)
CLINICAL SOCIAL WORK MATERNAL/CHILD NOTE  Patient Details  Name: Jennifer Rosales MRN: [id number] Date of Birth: 12/27/1985  Date:  01/09/2020  Clinical Social Worker Initiating Note:  Abundio Miu, [place] Date/Time: Initiated:  01/09/20/1220     Child's Name:  Jennifer Rosales   Biological Parents:  Mother, Father (Father: Kerra Guilfoil)   Need for Interpreter:  None   Reason for Referral:  Behavioral Health Concerns, Parental Support of Children with Anomalies/Syndromes, Other (Comment) (Infant's NICU Admission)   Address:  [place] [address]    Phone number:  [phone] (home)     Additional phone number:   Household Members/Support Persons (HM/SP):   Household Member/Support Person 1, Household Member/Support Person 2, Household Member/Support Person 3   HM/SP Name Relationship DOB or Age  HM/SP -1 Johnny Latu FOB/Husband    HM/SP -2 [place] son 07/30/13  HM/SP -[place] daughter 11/11/16  HM/SP -4        HM/SP -5        HM/SP -6        HM/SP -7        HM/SP -8          Natural Supports (not living in the home):  Parent   Professional Supports: None   Employment: Full-time   Type of Work: Chief Strategy Officer for [employer]   Education:  [place] arranged:    Museum/gallery curator Resources:  Medicaid   Other Resources:  [facility]   Cultural/Religious Considerations Which May Impact Rosales:    Strengths:  Engineer, materials, Ability to meet basic needs , Understanding of illness, Home prepared for child    Psychotropic Medications:         Pediatrician:    Careers adviser area  Pediatrician List:   Pension scheme manager Pediatrics ([place])  [place]      Pediatrician Fax Number:    Risk Factors/Current Problems:  None   Cognitive State:  Able to Concentrate , Alert , Insightful , Goal Oriented , Linear Thinking     Mood/Affect:  Comfortable , Calm , Interested    CSW Assessment: CSW met with MOB at bedside to discuss consult for infant's NICU admission and behavioral health concerns, FOB present. MOB granted CSW verbal permission to speak in front of FOB about anything. CSW introduced self and explained reason for consult. MOB was welcoming, pleasant and remained engaged during assessment. MOB reported that she resides with FOB and 2 children. 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 MOB reported that she receives [facility]. MOB reported that she has all items needed to Rosales for infant including a car seat and crib. CSW inquired about MOB's support system, MOB reported that FOB and her parents are her supports. MOB shared that her parents are currently providing childcare for older children.   CSW inquired about MOB's mental health history. MOB reported that she had postpartum depression with her previous child. MOB reported that it started around 2-3 weeks after giving birth and that she experienced crying a lot, being tired, sad, and having self doubt. MOB reported that she was prescribed medication which was helpful and her postpartum depression lasted about 6 months. MOB reported that she was able to wean off of the medication. MOB was unable to recall the name of the medication. MOB  denied any other mental health history. CSW inquired about how MOB was feeling emotionally after giving birth, MOB reported that she was emotional due to infant's NICU admission and diagnosis. CSW acknowledged and validated MOB's feelings. CSW informed MOB that infant meets criteria to apply for SSI benefits and explained the process. MOB reported that she was interested, CSW provided paperwork. MOB presented calm and did not demonstrate any acute mental health signs/symptoms. CSW assessed for safety, MOB denied SI and HI. CSW did not assess for domestic violence as FOB was present.   CSW provided  education regarding the baby blues period vs. perinatal mood disorders, discussed treatment and gave resources for mental health follow up if concerns arise.  CSW recommends self-evaluation during the postpartum time period using the New Mom Checklist from Postpartum Progress and encouraged MOB to contact a medical professional if symptoms are noted at any time.    CSW provided review of Sudden Infant Death Syndrome (SIDS) precautions.    CSW and MOB discussed infant's NICU admission. CSW informed MOB about the NICU, what to expect and resources/supports available while infant is admitted to the NICU. MOB reported that she feels well informed about infant's Rosales and denied any questions/concerns regarding the NICU. MOB denied any transportation barriers. MOB reported that meal vouchers would be helpful, CSW provided 6 meal vouchers.    CSW will continue to offer resources/supports while infant is admitted to the NICU.     CSW Plan/Description:  Sudden Infant Death Syndrome (SIDS) Education, Perinatal Mood and Anxiety Disorder (PMADs) Education, [employer] Income (SSI) Information, Other Patient/Family Education    Burnis Medin, LCSW 01/09/2020, 12:25 PM

## 2020-01-09 NOTE — Progress Notes (Signed)
Infant was in nursery and had dusky episode after being unable to clear secretions. Infant pulse ox reading in 60's with a 15 minute recovery time. Pediatrician called, neo consulted, and infant transferred to NICU. Discussing transfer with MOB and Neonatologist visited mother to discuss new plan of care. Mother seemed relieved infant had been closely watched and was being moved to where she can be monitored.

## 2020-01-09 NOTE — Progress Notes (Signed)
Postpartum Note Day # 1  S:  Patient doing well.  Pain controlled.  Tolerating regular diet.   Ambulating and voiding without difficulty.  Infant was taken to NICU for further evaluation.  Possible transfer of baby to [facility].  If infant transferred, she would like to be discharged home today. Denies fevers, chills, chest pain, SOB, N/V, or worsening bilateral LE edema.  Lochia: Minimal Infant feeding:  Bottle Circumcision:  NA Contraception:  Not discussed today  O: Temp:  [97.3 F (36.3 C)-98.7 F (37.1 C)] 97.7 F (36.5 C) (10/08 0610) Pulse Rate:  [56-248] 69 (10/08 0610) Resp:  [16-18] 18 (10/08 0610) BP: (108-183)/(47-169) 112/66 (10/08 0610) Gen: NAD, pleasant and cooperative Resp:  No increased work of breathing Abdomen: soft, non-distended, non-tender throughout Uterus: firm, non-tender, below umbilicus Ext: trace bilateral LE edema, no bilateral calf tenderness  Labs: Recent Labs    01/08/20 0030 01/09/20 0622  HGB 10.1* 9.9*    A/P: Pt is a 34 y.o. [id number]  s/p SVD  - Pain well controlled  - GU: UOP is adequate - GI: Tolerating regular diet - Activity: encouraged sitting up to chair and ambulation as tolerated - Prophylaxis: SCDs - Labs: stable as above  Disposition:  PPD#1-2   Steva Ready, DO [phone] (office)

## 2020-01-09 NOTE — Lactation Note (Signed)
This note was copied from a baby's chart. Lactation Consultation Note  Patient Name: Jennifer Rosales [id number] Date: 01/09/2020 Reason for consult: Maternal endocrine disorder;Follow-up assessment;NICU baby;Term Type of Endocrine Disorder?: Thyroid  Visited with mom of a 62 hours old FT NICU female, she's a P3 and BF her other babies for 2-3 months. LC noticed that pump kit was still intact in the room and asked mom about her pumping status. She told LC that baby has been doing well on the Similac 20 formula and that she'll just continuing doing so.   Mom said her baby is in the NICU anyway and she won't be able to BF due to the lip/cleft palate. LC explained that pumping and bottle feeding can still provide breastmilk feeding but mom just wishes to stop/not start pumping. LC supportive of family's decision as long as baby is getting fed.  Reviewed engorgement prevention and treatment, mom aware of the onset of lactogenesis II between day 3-5; she does have her personal breast pump in the room in case she changes her mind. Lactation services no longer needed at this point.  Maternal Data    Feeding Feeding Type: Formula Nipple Type: Dr. Roosvelt Harps level 1 (one way valve)  LATCH Score                   Interventions Interventions: Breast feeding basics reviewed  Lactation Tools Discussed/Used     Consult Status Consult Status: Complete Date: 01/09/20 Follow-up type: Call as needed    [name] 01/09/2020, 5:20 PM

## 2020-01-09 NOTE — Anesthesia Postprocedure Evaluation (Signed)
Anesthesia Post Note  Patient: Jennifer Rosales  Procedure(s) Performed: AN AD HOC LABOR EPIDURAL     Anesthesia Post Evaluation No complications documented.  Last Vitals:  Vitals:   01/09/20 0200 01/09/20 0610  BP: 124/70 112/66  Pulse: 66 69  Resp: 18 18  Temp: (!) 36.3 C 36.5 C    Last Pain:  Vitals:   01/09/20 0610  TempSrc: Oral  PainSc: 0-No pain   Pain Goal:                   Hannie Shoe

## 2020-01-10 MED ORDER — IBUPROFEN 800 MG PO TABS: 800.0000 mg | ORAL_TABLET | Freq: Three times a day (TID) | ORAL | 0 refills | Status: DC | PRN

## 2020-01-10 NOTE — Discharge Summary (Signed)
Postpartum Discharge Summary  01/10/20     Patient Name: Jennifer Rosales DOB: 01-15-1986 MRN: [id number]  Date of admission: 01/08/2020 Delivery date:01/08/2020  Delivering provider: Christophe Louis  Date of discharge: 01/10/2020  Admitting diagnosis: Hyperthyroidism affecting pregnancy in third trimester [O99.283, E05.90] Intrauterine pregnancy: [redacted]w[redacted]d    Secondary diagnosis:  Active Problems:   Hyperthyroidism affecting pregnancy in third trimester   Additional problems: Fetal cleft lip/palate    Discharge diagnosis: Term Pregnancy Delivered                                              Post partum procedures:None Augmentation: AROM, Pitocin and Cytotec Complications: None  Hospital course: Induction of Labor With Vaginal Delivery   34y.o. yo G3P3003 at [id number] admitted to the hospital 01/08/2020 for induction of labor.  Indication for induction: Hyperthyroidism, Fetal cleft lip/palate.  Patient had an uncomplicated labor course as follows: Membrane Rupture Time/Date: 1:20 PM ,01/08/2020   Delivery Method:Vaginal, Spontaneous  Episiotomy:   Lacerations:  2nd degree  Details of delivery can be found in separate delivery note.  Patient had a routine postpartum course. Patient is discharged home 01/10/20.  Newborn Data: Birth date:01/08/2020  Birth time:5:01 PM  Gender:Female  Living status:Living  Apgars:8 ,9  Weight:3291 g   Magnesium Sulfate received: No BMZ received: No Rhophylac:No MMR:No T-DaP:Given prenatally Flu: Unknown Transfusion:No  Physical exam  Vitals:   01/09/20 2200 01/10/20 0500 01/10/20 1128 01/10/20 1129  BP: 108/70 102/82 113/70 128/67  Pulse: 82 79 80   Resp: 18 18 20    Temp: 98.1 F (36.7 C) 98.3 F (36.8 C) 98.1 F (36.7 C)   TempSrc: Oral Oral    Weight:      Height:       General: alert, cooperative and no distress Lochia: appropriate Uterine Fundus: firm Incision: N/A DVT Evaluation: No evidence of DVT seen on physical exam. No  cords or calf tenderness. Labs: Lab Results  Component Value Date   WBC 8.3 01/09/2020   HGB 9.9 (L) 01/09/2020   HCT 31.2 (L) 01/09/2020   MCV 79.8 (L) 01/09/2020   PLT 199 01/09/2020   CMP Latest Ref Rng & Units 10/16/2019  Glucose 70 - 99 mg/dL 74  BUN 6 - 20 mg/dL 6  Creatinine 0.44 - 1.00 mg/dL 0.55  Sodium 135 - 145 mmol/L 137  Potassium 3.5 - 5.1 mmol/L 3.7  Chloride 98 - 111 mmol/L 105  CO2 22 - 32 mmol/L 22  Calcium 8.9 - 10.3 mg/dL 8.4(L)  Total Protein 6.5 - 8.1 g/dL 5.6(L)  Total Bilirubin 0.3 - 1.2 mg/dL <0.1(L)  Alkaline Phos 38 - 126 U/L 63  AST 15 - 41 U/L 11(L)  ALT 0 - 44 U/L 11   Edinburgh Score: Edinburgh Postnatal Depression Scale Screening Tool 01/09/2020  I have been able to laugh and see the funny side of things. (No Data)  I have looked forward with enjoyment to things. -  I have blamed myself unnecessarily when things went wrong. -  I have been anxious or worried for no good reason. -  I have felt scared or panicky for no good reason. -  Things have been getting on top of me. -  I have been so unhappy that I have had difficulty sleeping. -  I have felt sad or miserable. -  I have been  so unhappy that I have been crying. -  The thought of harming myself has occurred to me. [name] Postnatal Depression Scale Total -      After visit meds:  Allergies as of 01/10/2020   No Known Allergies     Medication List    TAKE these medications   atenolol 25 MG tablet Commonly known as: TENORMIN Take by mouth as needed.   ibuprofen 800 MG tablet Commonly known as: ADVIL Take 1 tablet (800 mg total) by mouth every 8 (eight) hours as needed.   Prenatal Gummies/DHA & FA 0.4-32.5 MG Chew Chew 1 each by mouth daily.   promethazine 12.5 MG tablet Commonly known as: PHENERGAN Take 1 tablet (12.5 mg total) by mouth at bedtime as needed for nausea or vomiting.   propylthiouracil 50 MG tablet Commonly known as: PTU Take 100 mg by mouth daily.    venlafaxine 37.5 MG tablet Commonly known as: EFFEXOR Take 37.5 mg by mouth 2 (two) times daily.        Discharge home in stable condition Infant Feeding: Bottle Infant Disposition:NICU Discharge instruction: per After Visit Summary and Postpartum booklet. Activity: Advance as tolerated. Pelvic rest for 6 weeks.  Diet: routine diet Anticipated Birth Control: Undecided Postpartum Appointment:6 weeks Additional Postpartum F/U: None Future Appointments:No future appointments. Follow up Visit:  Follow-up Information    Christophe Louis, MD Follow up in 6 week(s).   Specialty: Obstetrics and Gynecology Contact information: [address] [place] [address] [phone]                   01/10/2020 Drema Dallas, DO

## 2020-03-22 DIAGNOSIS — E0500 Thyrotoxicosis with diffuse goiter without thyrotoxic crisis or storm: Secondary | ICD-10-CM | POA: Diagnosis not present

## 2020-03-29 DIAGNOSIS — E0500 Thyrotoxicosis with diffuse goiter without thyrotoxic crisis or storm: Secondary | ICD-10-CM | POA: Diagnosis not present

## 2020-04-15 DIAGNOSIS — E0500 Thyrotoxicosis with diffuse goiter without thyrotoxic crisis or storm: Secondary | ICD-10-CM | POA: Diagnosis not present

## 2020-04-15 DIAGNOSIS — E0590 Thyrotoxicosis, unspecified without thyrotoxic crisis or storm: Secondary | ICD-10-CM | POA: Diagnosis not present

## 2020-05-12 DIAGNOSIS — K912 Postsurgical malabsorption, not elsewhere classified: Secondary | ICD-10-CM | POA: Diagnosis not present

## 2020-05-12 DIAGNOSIS — Z903 Acquired absence of stomach [part of]: Secondary | ICD-10-CM | POA: Diagnosis not present

## 2020-06-01 HISTORY — PX: THYROIDECTOMY: SHX17

## 2020-06-17 DIAGNOSIS — Z903 Acquired absence of stomach [part of]: Secondary | ICD-10-CM | POA: Diagnosis not present

## 2020-06-17 DIAGNOSIS — R790 Abnormal level of blood mineral: Secondary | ICD-10-CM | POA: Diagnosis not present

## 2020-06-17 DIAGNOSIS — K912 Postsurgical malabsorption, not elsewhere classified: Secondary | ICD-10-CM | POA: Diagnosis not present

## 2020-06-17 DIAGNOSIS — E559 Vitamin D deficiency, unspecified: Secondary | ICD-10-CM | POA: Diagnosis not present

## 2020-06-23 DIAGNOSIS — E0500 Thyrotoxicosis with diffuse goiter without thyrotoxic crisis or storm: Secondary | ICD-10-CM | POA: Diagnosis not present

## 2020-06-23 DIAGNOSIS — E0590 Thyrotoxicosis, unspecified without thyrotoxic crisis or storm: Secondary | ICD-10-CM | POA: Diagnosis not present

## 2020-06-24 DIAGNOSIS — E0500 Thyrotoxicosis with diffuse goiter without thyrotoxic crisis or storm: Secondary | ICD-10-CM | POA: Diagnosis not present

## 2020-06-27 DIAGNOSIS — Z20822 Contact with and (suspected) exposure to covid-19: Secondary | ICD-10-CM | POA: Diagnosis not present

## 2020-06-27 DIAGNOSIS — Z01812 Encounter for preprocedural laboratory examination: Secondary | ICD-10-CM | POA: Diagnosis not present

## 2020-06-30 DIAGNOSIS — E890 Postprocedural hypothyroidism: Secondary | ICD-10-CM | POA: Diagnosis not present

## 2020-06-30 DIAGNOSIS — E669 Obesity, unspecified: Secondary | ICD-10-CM | POA: Diagnosis not present

## 2020-06-30 DIAGNOSIS — E049 Nontoxic goiter, unspecified: Secondary | ICD-10-CM | POA: Diagnosis not present

## 2020-06-30 DIAGNOSIS — E0500 Thyrotoxicosis with diffuse goiter without thyrotoxic crisis or storm: Secondary | ICD-10-CM | POA: Diagnosis not present

## 2020-06-30 DIAGNOSIS — Z6836 Body mass index (BMI) 36.0-36.9, adult: Secondary | ICD-10-CM | POA: Diagnosis not present

## 2020-07-03 DIAGNOSIS — R200 Anesthesia of skin: Secondary | ICD-10-CM | POA: Diagnosis not present

## 2020-07-03 DIAGNOSIS — E890 Postprocedural hypothyroidism: Secondary | ICD-10-CM | POA: Insufficient documentation

## 2020-07-07 DIAGNOSIS — E079 Disorder of thyroid, unspecified: Secondary | ICD-10-CM | POA: Diagnosis not present

## 2020-07-14 DIAGNOSIS — E890 Postprocedural hypothyroidism: Secondary | ICD-10-CM | POA: Diagnosis not present

## 2020-07-19 DIAGNOSIS — E890 Postprocedural hypothyroidism: Secondary | ICD-10-CM | POA: Diagnosis not present

## 2020-08-05 DIAGNOSIS — E890 Postprocedural hypothyroidism: Secondary | ICD-10-CM | POA: Diagnosis not present

## 2020-08-05 DIAGNOSIS — E0500 Thyrotoxicosis with diffuse goiter without thyrotoxic crisis or storm: Secondary | ICD-10-CM | POA: Diagnosis not present

## 2020-08-13 DIAGNOSIS — R790 Abnormal level of blood mineral: Secondary | ICD-10-CM | POA: Diagnosis not present

## 2020-08-13 DIAGNOSIS — D508 Other iron deficiency anemias: Secondary | ICD-10-CM | POA: Diagnosis not present

## 2020-08-13 DIAGNOSIS — Z9884 Bariatric surgery status: Secondary | ICD-10-CM | POA: Diagnosis not present

## 2020-08-13 DIAGNOSIS — Z903 Acquired absence of stomach [part of]: Secondary | ICD-10-CM | POA: Diagnosis not present

## 2020-08-13 DIAGNOSIS — N920 Excessive and frequent menstruation with regular cycle: Secondary | ICD-10-CM | POA: Diagnosis not present

## 2020-08-13 DIAGNOSIS — K912 Postsurgical malabsorption, not elsewhere classified: Secondary | ICD-10-CM | POA: Diagnosis not present

## 2020-09-10 DIAGNOSIS — D509 Iron deficiency anemia, unspecified: Secondary | ICD-10-CM | POA: Diagnosis not present

## 2020-09-10 DIAGNOSIS — D508 Other iron deficiency anemias: Secondary | ICD-10-CM | POA: Diagnosis not present

## 2020-09-24 DIAGNOSIS — D508 Other iron deficiency anemias: Secondary | ICD-10-CM | POA: Diagnosis not present

## 2020-11-23 ENCOUNTER — Other Ambulatory Visit: Payer: Self-pay

## 2020-11-23 ENCOUNTER — Encounter: Payer: Self-pay | Admitting: Family Medicine

## 2020-11-23 ENCOUNTER — Ambulatory Visit: Payer: Medicaid Other | Admitting: Family Medicine

## 2020-11-23 VITALS — BP 111/78 | HR 67 | Temp 98.0°F | Resp 20 | Ht 66.0 in | Wt 237.0 lb

## 2020-11-23 DIAGNOSIS — E890 Postprocedural hypothyroidism: Secondary | ICD-10-CM

## 2020-11-23 DIAGNOSIS — Z7689 Persons encountering health services in other specified circumstances: Secondary | ICD-10-CM

## 2020-11-23 DIAGNOSIS — Z9889 Other specified postprocedural states: Secondary | ICD-10-CM

## 2020-11-23 DIAGNOSIS — D508 Other iron deficiency anemias: Secondary | ICD-10-CM

## 2020-11-23 DIAGNOSIS — Z1159 Encounter for screening for other viral diseases: Secondary | ICD-10-CM

## 2020-11-23 DIAGNOSIS — E559 Vitamin D deficiency, unspecified: Secondary | ICD-10-CM

## 2020-11-23 DIAGNOSIS — Z9884 Bariatric surgery status: Secondary | ICD-10-CM

## 2020-11-23 DIAGNOSIS — E0500 Thyrotoxicosis with diffuse goiter without thyrotoxic crisis or storm: Secondary | ICD-10-CM

## 2020-11-23 LAB — HEMOGLOBIN A1C
Est. average glucose Bld gHb Est-mCnc: 97 mg/dL
Hgb A1c MFr Bld: 5 % (ref 4.8–5.6)

## 2020-11-23 NOTE — Progress Notes (Signed)
Subjective:  Patient ID: Jennifer Rosales, female    DOB: Mar 29, 1986, 35 y.o.   MRN: [id number]  Patient Care Team: Baruch Gouty, FNP as PCP - General (Family Medicine) Christophe Louis, MD (Obstetrics and Gynecology)   Chief Complaint:  Establish Care (New )   HPI: Jennifer Rosales is a 35 y.o. female presenting on 11/23/2020 for Establish Care (New )  Pt presents today to establish care with new PCP. She was formerly seen at [facility], her provider is not longer there. She has Grave's disease and is s/p total thyroidectomy. States she has had significant weight gain since surgery. She was seen by endocrinology 2 months ago and does not go back until 01/2021. She does not take her repletion therapy on an empty stomach, takes with all other medications. She reports changes in her mood, concentration, hair, and sleep since surgery. She states she gets frustrated easily and feels unorganized in a daily basis.  She has hypocalcemia post surgery and is on repletion therapy. She reports her lips and toes have been tingling and her lips twitching lately. No tetany noted.  She had a gastric sleeve 7 years ago and was doing well with weight loss until her thyroidectomy.  She has IDA and is on iron daily. No constipation reported.  PAP is up to date, no recent labs in EHR.   Relevant past medical, surgical, family, and social history reviewed and updated as indicated.  Allergies and medications reviewed and updated. Data reviewed: Chart in Epic.   Past Medical History:  Diagnosis Date   Anemia    Anxiety    Graves disease    Graves disease    Obesity     Past Surgical History:  Procedure Laterality Date   CHOLECYSTECTOMY     GALLBLADDER SURGERY  2008   LAPAROSCOPIC GASTRIC SLEEVE RESECTION     THYROIDECTOMY  06/01/2020   TONSILLECTOMY     TONSILLECTOMY AND ADENOIDECTOMY     WISDOM TOOTH EXTRACTION      Social History   Socioeconomic History   Marital status:  Married    Spouse name: Regina Eck   Number of children: 3   Years of education: Not on file   Highest education level: Not on file  Occupational History   Not on file  Tobacco Use   Smoking status: Never   Smokeless tobacco: Never  Vaping Use   Vaping Use: Never used  Substance and Sexual Activity   Alcohol use: Not Currently   Drug use: Not Currently   Sexual activity: Yes  Other Topics Concern   Not on file  Social History Narrative   ** Merged History Encounter **       Social Determinants of Health   Financial Resource Strain: Not on file  Food Insecurity: Not on file  Transportation Needs: Not on file  Physical Activity: Not on file  Stress: Not on file  Social Connections: Not on file  Intimate Partner Violence: Not on file    Outpatient Encounter Medications as of 11/23/2020  Medication Sig   calcitRIOL (ROCALTROL) 0.5 MCG capsule Take 1 capsule by mouth in the morning and at bedtime.   IFEREX 150 150 MG capsule Take 150 mg by mouth 2 (two) times daily.   SYNTHROID 175 MCG tablet Take 175 mcg by mouth daily.   [DISCONTINUED] atenolol (TENORMIN) 25 MG tablet Take by mouth as needed. (Patient not taking: Reported on 12/12/2019)   [DISCONTINUED] ibuprofen (  ADVIL) 800 MG tablet Take 1 tablet (800 mg total) by mouth every 8 (eight) hours as needed.   [DISCONTINUED] Prenatal MV-Min-FA-Omega-3 (PRENATAL GUMMIES/DHA & FA) 0.4-32.5 MG CHEW Chew 1 each by mouth daily.   [DISCONTINUED] promethazine (PHENERGAN) 12.5 MG tablet Take 1 tablet (12.5 mg total) by mouth at bedtime as needed for nausea or vomiting. (Patient not taking: Reported on 12/12/2019)   [DISCONTINUED] propylthiouracil (PTU) 50 MG tablet Take 100 mg by mouth daily. (Patient not taking: Reported on 12/12/2019)   [DISCONTINUED] venlafaxine (EFFEXOR) 37.5 MG tablet Take 37.5 mg by mouth 2 (two) times daily. (Patient not taking: Reported on 12/12/2019)   No facility-administered encounter medications on file as of  11/23/2020.    No Known Allergies  Review of Systems  Constitutional:  Positive for activity change, appetite change, fatigue and unexpected weight change. Negative for chills, diaphoresis and fever.  HENT: Negative.    Eyes: Negative.  Negative for photophobia, pain, discharge, redness, itching and visual disturbance.  Respiratory:  Negative for cough, chest tightness and shortness of breath.   Cardiovascular:  Negative for chest pain, palpitations and leg swelling.  Gastrointestinal:  Negative for abdominal distention, abdominal pain, blood in stool, constipation, diarrhea, nausea and vomiting.  Endocrine: Positive for cold intolerance. Negative for heat intolerance, polydipsia, polyphagia and polyuria.  Genitourinary:  Negative for decreased urine volume, difficulty urinating, dysuria, frequency and urgency.  Musculoskeletal:  Negative for arthralgias and myalgias.  Skin: Negative.   Allergic/Immunologic: Negative.   Neurological:  Positive for numbness. Negative for dizziness, tremors, seizures, syncope, facial asymmetry, speech difficulty, weakness, light-headedness and headaches.       Paresthesia to lips and toes  Hematological: Negative.   Psychiatric/Behavioral:  Positive for agitation, decreased concentration, dysphoric mood and sleep disturbance. Negative for behavioral problems, confusion, hallucinations, self-injury and suicidal ideas. The patient is not nervous/anxious and is not hyperactive.   All other systems reviewed and are negative.      Objective:  BP 111/78   Pulse 67   Temp 98 F (36.7 C)   Resp 20   Ht 5' 6"  (1.676 m)   Wt 237 lb (107.5 kg)   LMP 11/16/2020   SpO2 95%   Breastfeeding No   BMI 38.25 kg/m    Wt Readings from Last 3 Encounters:  11/23/20 237 lb (107.5 kg)  01/08/20 245 lb 8 oz (111.4 kg)  12/28/19 244 lb 12.8 oz (111 kg)    Physical Exam Vitals and nursing note reviewed.  Constitutional:      General: She is not in acute distress.     Appearance: Normal appearance. She is well-developed and well-groomed. She is morbidly obese. She is not ill-appearing, toxic-appearing or diaphoretic.  HENT:     Head: Normocephalic and atraumatic.     Jaw: There is normal jaw occlusion.     Right Ear: Hearing, tympanic membrane, ear canal and external ear normal.     Left Ear: Hearing, tympanic membrane, ear canal and external ear normal.     Nose: Nose normal.     Mouth/Throat:     Lips: Pink.     Mouth: Mucous membranes are moist.     Pharynx: Oropharynx is clear. Uvula midline.  Eyes:     General: Lids are normal.     Extraocular Movements: Extraocular movements intact.     Conjunctiva/sclera: Conjunctivae normal.     Pupils: Pupils are equal, round, and reactive to light.  Neck:     Thyroid: No thyroid mass,  thyromegaly or thyroid tenderness.     Vascular: No carotid bruit or JVD.     Trachea: Trachea and phonation normal.  Cardiovascular:     Rate and Rhythm: Normal rate and regular rhythm.     Chest Wall: PMI is not displaced.     Pulses: Normal pulses.     Heart sounds: Normal heart sounds. No murmur heard.   No friction rub. No gallop.  Pulmonary:     Effort: Pulmonary effort is normal. No respiratory distress.     Breath sounds: Normal breath sounds. No wheezing.  Abdominal:     General: Abdomen is protuberant. Bowel sounds are normal. There is no distension or abdominal bruit.     Palpations: Abdomen is soft. There is no hepatomegaly or splenomegaly.     Tenderness: There is no abdominal tenderness. There is no right CVA tenderness or left CVA tenderness.     Hernia: No hernia is present.  Musculoskeletal:        General: Normal range of motion.     Cervical back: Normal range of motion and neck supple.     Right lower leg: No edema.     Left lower leg: No edema.  Lymphadenopathy:     Cervical: No cervical adenopathy.  Skin:    General: Skin is warm and dry.     Capillary Refill: Capillary refill takes less  than 2 seconds.     Coloration: Skin is not cyanotic, jaundiced or pale.     Findings: No rash.  Neurological:     General: No focal deficit present.     Mental Status: She is alert and oriented to person, place, and time.     Cranial Nerves: Cranial nerves are intact. No cranial nerve deficit.     Sensory: Sensation is intact. No sensory deficit.     Motor: Motor function is intact. No weakness.     Coordination: Coordination is intact. Coordination normal.     Gait: Gait is intact. Gait normal.     Deep Tendon Reflexes: Reflexes are normal and symmetric. Reflexes normal.  Psychiatric:        Attention and Perception: Attention and perception normal.        Mood and Affect: Mood normal. Affect is tearful.        Speech: Speech normal.        Behavior: Behavior normal. Behavior is cooperative.        Thought Content: Thought content normal.        Cognition and Memory: Cognition and memory normal.        Judgment: Judgment normal.    Results for orders placed or performed during the hospital encounter of 01/08/20  OB RESULT CONSOLE Group B Strep  Result Value Ref Range   GBS Negative   CBC  Result Value Ref Range   WBC 9.8 4.0 - 10.5 K/uL   RBC 4.01 3.87 - 5.11 MIL/uL   Hemoglobin 10.1 (L) 12.0 - 15.0 g/dL   HCT 31.9 (L) 36.0 - 46.0 %   MCV 79.6 (L) 80.0 - 100.0 fL   MCH 25.2 (L) 26.0 - 34.0 pg   MCHC 31.7 30.0 - 36.0 g/dL   RDW 14.4 11.5 - 15.5 %   Platelets 213 150 - 400 K/uL   nRBC 0.0 0.0 - 0.2 %  RPR  Result Value Ref Range   RPR Ser Ql NON REACTIVE NON REACTIVE  OB RESULTS CONSOLE HIV antibody  Result Value Ref Range  HIV Non-reactive   OB RESULTS CONSOLE Rubella Antibody  Result Value Ref Range   Rubella Immune   OB RESULTS CONSOLE Hepatitis B surface antigen  Result Value Ref Range   Hepatitis B Surface Ag Negative   CBC  Result Value Ref Range   WBC 8.3 4.0 - 10.5 K/uL   RBC 3.91 3.87 - 5.11 MIL/uL   Hemoglobin 9.9 (L) 12.0 - 15.0 g/dL   HCT 31.2 (L)  36.0 - 46.0 %   MCV 79.8 (L) 80.0 - 100.0 fL   MCH 25.3 (L) 26.0 - 34.0 pg   MCHC 31.7 30.0 - 36.0 g/dL   RDW 14.6 11.5 - 15.5 %   Platelets 199 150 - 400 K/uL   nRBC 0.0 0.0 - 0.2 %  Type and screen  Result Value Ref Range   ABO/RH(D) A POS    Antibody Screen NEG    Sample Expiration      01/11/2020,2359 Performed at [facility], [address], [place], [place] [address]        Pertinent labs & imaging results that were available during my care of the patient were reviewed by me and considered in my medical decision making.  Assessment & Plan:  Caprina was seen today for establish care.  Diagnoses and all orders for this visit:  Encounter to establish care Pt establishing care with new PCP as her PCP is no longer practicing. She was last seen in 2021 by PCP.   Graves disease S/P total thyroidectomy Followed by endocrinology on a regular basis. Last TSH in 05/2020. Has gained at least 7 lbs in last 2 weeks and complains of ongoing fatigue. Has been taking repletion therapy with all other medications and not on an empty stomach. Proper dosing discussed in detail, aware to take on an empty stomach and at least 3-4 hours prior to any vitamins or supplements. Will check thyroid panel today.  -     Thyroid Panel With TSH  Hypocalcemia On repletion therapy but does complain of twitching of lip with paresthesia of lips and toes at times. Will check. CMP today.  -     CMP14+EGFR  Iron deficiency anemia secondary to inadequate dietary iron intake On iron repletion therapy. Last CBC 01/2020, will check today along with anemia panel.  -     CBC with Differential/Platelet -     Anemia Profile B  Morbid (severe) obesity due to excess calories (HCC) Diet and exercise discussed in detail. Will check below labs. Discussed possible medication therapy if thyroid repletion therapy is adequate.  -     CBC with Differential/Platelet -     CMP14+EGFR -     Lipid panel -     Thyroid  Panel With TSH -     Vitamin B12 -     VITAMIN D 25 Hydroxy (Vit-D Deficiency, Fractures) -     Hemoglobin A1c  Vitamin D deficiency Not on repletion therapy. Will check levels today.  -     VITAMIN D 25 Hydroxy (Vit-D Deficiency, Fractures)  S/P laparoscopic sleeve gastrectomy Will check below labs today for deficiencies.  -     CBC with Differential/Platelet -     CMP14+EGFR -     Lipid panel -     Thyroid Panel With TSH -     Vitamin B12 -     VITAMIN D 25 Hydroxy (Vit-D Deficiency, Fractures) -     Hemoglobin A1c  Encounter for hepatitis C screening test  for low risk patient -     Hepatitis C antibody    Continue all other maintenance medications.  Follow up plan: Return in about 3 months (around 02/23/2021), or if symptoms worsen or fail to improve.   Continue healthy lifestyle choices, including diet (rich in fruits, vegetables, and lean proteins, and low in salt and simple carbohydrates) and exercise (at least 30 minutes of moderate physical activity daily).  Educational handout given for hypothyroidism  The above assessment and management plan was discussed with the patient. The patient verbalized understanding of and has agreed to the management plan. Patient is aware to call the clinic if they develop any new symptoms or if symptoms persist or worsen. Patient is aware when to return to the clinic for a follow-up visit. Patient educated on when it is appropriate to go to the emergency department.   Monia Pouch, FNP-C [facility] [phone]

## 2020-11-24 LAB — THYROID PANEL WITH TSH
Free Thyroxine Index: 2.1 (ref 1.2–4.9)
T3 Uptake Ratio: 28 % (ref 24–39)
T4, Total: 7.6 ug/dL (ref 4.5–12.0)
TSH: 32.8 u[IU]/mL — ABNORMAL HIGH (ref 0.450–4.500)

## 2020-11-24 LAB — CMP14+EGFR
ALT: 13 IU/L (ref 0–32)
AST: 15 IU/L (ref 0–40)
Albumin/Globulin Ratio: 2.2 (ref 1.2–2.2)
Albumin: 4.6 g/dL (ref 3.8–4.8)
Alkaline Phosphatase: 77 IU/L (ref 44–121)
BUN/Creatinine Ratio: 24 — ABNORMAL HIGH (ref 9–23)
BUN: 18 mg/dL (ref 6–20)
Bilirubin Total: 0.3 mg/dL (ref 0.0–1.2)
CO2: 22 mmol/L (ref 20–29)
Calcium: 9 mg/dL (ref 8.7–10.2)
Chloride: 102 mmol/L (ref 96–106)
Creatinine, Ser: 0.74 mg/dL (ref 0.57–1.00)
Globulin, Total: 2.1 g/dL (ref 1.5–4.5)
Glucose: 76 mg/dL (ref 65–99)
Potassium: 4 mmol/L (ref 3.5–5.2)
Sodium: 138 mmol/L (ref 134–144)
Total Protein: 6.7 g/dL (ref 6.0–8.5)
eGFR: 109 mL/min/{1.73_m2} (ref >59)

## 2020-11-24 LAB — ANEMIA PROFILE B
Basophils Absolute: 0 10*3/uL (ref 0.0–0.2)
Basos: 0 %
EOS (ABSOLUTE): 0.3 10*3/uL (ref 0.0–0.4)
Eos: 4 %
Ferritin: 139 ng/mL (ref 15–150)
Folate: 6.6 ng/mL (ref >3.0)
Hematocrit: 43.9 % (ref 34.0–46.6)
Hemoglobin: 14.5 g/dL (ref 11.1–15.9)
Immature Grans (Abs): 0 10*3/uL (ref 0.0–0.1)
Immature Granulocytes: 0 %
Iron Saturation: 33 % (ref 15–55)
Iron: 79 ug/dL (ref 27–159)
Lymphocytes Absolute: 2 10*3/uL (ref 0.7–3.1)
Lymphs: 30 %
MCH: 28.4 pg (ref 26.6–33.0)
MCHC: 33 g/dL (ref 31.5–35.7)
MCV: 86 fL (ref 79–97)
Monocytes Absolute: 0.4 10*3/uL (ref 0.1–0.9)
Monocytes: 6 %
Neutrophils Absolute: 4 10*3/uL (ref 1.4–7.0)
Neutrophils: 60 %
Platelets: 225 10*3/uL (ref 150–450)
RBC: 5.11 x10E6/uL (ref 3.77–5.28)
RDW: 18 % — ABNORMAL HIGH (ref 11.7–15.4)
Retic Ct Pct: 1.3 % (ref 0.6–2.6)
Total Iron Binding Capacity: 243 ug/dL — ABNORMAL LOW (ref 250–450)
UIBC: 164 ug/dL (ref 131–425)
Vitamin B-12: 320 pg/mL (ref 232–1245)
WBC: 6.7 10*3/uL (ref 3.4–10.8)

## 2020-11-24 LAB — VITAMIN D 25 HYDROXY (VIT D DEFICIENCY, FRACTURES): Vit D, 25-Hydroxy: 27.5 ng/mL — ABNORMAL LOW (ref 30.0–100.0)

## 2020-11-24 LAB — LIPID PANEL
Chol/HDL Ratio: 3 ratio (ref 0.0–4.4)
Cholesterol, Total: 196 mg/dL (ref 100–199)
HDL: 65 mg/dL (ref >39)
LDL Chol Calc (NIH): 118 mg/dL — ABNORMAL HIGH (ref 0–99)
Triglycerides: 72 mg/dL (ref 0–149)
VLDL Cholesterol Cal: 13 mg/dL (ref 5–40)

## 2020-11-24 LAB — HEPATITIS C ANTIBODY: Hep C Virus Ab: <0.1 s/co ratio (ref 0.0–0.9)

## 2020-11-24 MED ORDER — LEVOTHYROXINE SODIUM 200 MCG PO TABS: 200.0000 ug | ORAL_TABLET | Freq: Every day | ORAL | 3 refills | Status: DC

## 2020-11-24 NOTE — Addendum Note (Signed)
Addended by: Sonny Masters on: 11/24/2020 07:56 AM   Modules accepted: Orders

## 2020-12-10 ENCOUNTER — Encounter: Payer: Self-pay | Admitting: Family Medicine

## 2020-12-11 NOTE — Telephone Encounter (Signed)
Mounjaro. If interested, will need to make an appointment with Raynelle Fanning.

## 2020-12-16 ENCOUNTER — Telehealth: Payer: Self-pay | Admitting: Pharmacist

## 2020-12-16 ENCOUNTER — Ambulatory Visit: Payer: Medicaid Other | Admitting: Pharmacist

## 2020-12-16 NOTE — Telephone Encounter (Signed)
Hey!!   Unfortunately, medicaid won't cover Mounjaro for obesity and she is unable to use copay card (since government program)  I spoke with patient --> she has not tried anything for weight loss OTC or RX.  Phentermine is not my favorite, but likely the cheapest (cash price).  BP looks controlled. I'm happy to call back and discuss! I'm unable to send in any controlled substances.  I will also provide dietary counseling, etc at that time!  Thank you!!! Raynelle Fanning

## 2020-12-22 ENCOUNTER — Encounter: Payer: Self-pay | Admitting: Family Medicine

## 2021-01-25 NOTE — Telephone Encounter (Signed)
Re: orlistat I honestly don't know how much benefit it would offer her If she is already following a low fat diet (s/p bariatric), however it is available OTC ($60-90/month).    All other options have been exhausted.  No medications for anti-obesity covered under medicaid.

## 2021-02-03 ENCOUNTER — Encounter: Payer: Self-pay | Admitting: Family Medicine

## 2021-02-15 ENCOUNTER — Ambulatory Visit: Payer: Medicaid Other | Admitting: Pharmacist

## 2021-02-22 ENCOUNTER — Encounter: Payer: Self-pay | Admitting: Pharmacist

## 2021-02-22 ENCOUNTER — Encounter: Payer: Self-pay | Admitting: Family Medicine

## 2021-02-22 ENCOUNTER — Ambulatory Visit: Payer: Medicaid Other | Admitting: Family Medicine

## 2021-02-22 ENCOUNTER — Other Ambulatory Visit: Payer: Self-pay

## 2021-02-22 VITALS — BP 103/74 | HR 64 | Temp 97.8°F | Ht 66.0 in | Wt 242.0 lb

## 2021-02-22 DIAGNOSIS — E559 Vitamin D deficiency, unspecified: Secondary | ICD-10-CM | POA: Diagnosis not present

## 2021-02-22 DIAGNOSIS — K219 Gastro-esophageal reflux disease without esophagitis: Secondary | ICD-10-CM | POA: Insufficient documentation

## 2021-02-22 DIAGNOSIS — E7800 Pure hypercholesterolemia, unspecified: Secondary | ICD-10-CM

## 2021-02-22 DIAGNOSIS — E890 Postprocedural hypothyroidism: Secondary | ICD-10-CM | POA: Diagnosis not present

## 2021-02-22 DIAGNOSIS — D508 Other iron deficiency anemias: Secondary | ICD-10-CM

## 2021-02-22 DIAGNOSIS — E0500 Thyrotoxicosis with diffuse goiter without thyrotoxic crisis or storm: Secondary | ICD-10-CM | POA: Diagnosis not present

## 2021-02-22 LAB — BAYER DCA HB A1C WAIVED: HB A1C (BAYER DCA - WAIVED): 4.7 % — ABNORMAL LOW (ref 4.8–5.6)

## 2021-02-22 MED ORDER — ORLISTAT 60 MG PO CAPS: 60.0000 mg | ORAL_CAPSULE | Freq: Three times a day (TID) | ORAL | 0 refills | Status: DC

## 2021-02-22 MED ORDER — FAMOTIDINE 20 MG PO TABS: 20.0000 mg | ORAL_TABLET | Freq: Two times a day (BID) | ORAL | 2 refills | Status: DC

## 2021-02-22 NOTE — Addendum Note (Signed)
Addended by: Sonny Masters on: 02/22/2021 04:37 PM   Modules accepted: Orders

## 2021-02-22 NOTE — Progress Notes (Signed)
Subjective:  Patient ID: Jennifer Rosales, female    DOB: 01-11-1986, 35 y.o.   MRN: [id number]  Patient Care Team: Sonny Masters, FNP as PCP - General (Family Medicine) Rosales Leitz, MD (Obstetrics and Gynecology) Danella Maiers, [facility] as Pharmacist (Family Medicine)   Chief Complaint:  Medical Management of Chronic Issues   HPI: Jennifer Rosales is a 35 y.o. female presenting on 02/22/2021 for Medical Management of Chronic Issues  Patient presents today for follow-up and management of chronic medical conditions.  Patient has Graves' disease and is status post total thyroidectomy.  Last TSH was significantly elevated.  She is on 200 mcg of levothyroxine daily.  She states she has noticed that her hair has started falling out again.  She does have some fatigue and weight gain as well.  She has a history of iron deficiency anemia and is on repletion therapy.  She denies any chest pain, shortness of breath, dizziness, fatigue, or syncope.  No significant constipation with iron therapy.  She has vitamin D deficiency and is on repletion therapy.  She denies any significant arthralgias or myalgias.  Her LDL was noted to be elevated at last visit, she is not on statin therapy.  She is fasting today.  She is status post gastric bypass with significant weight gain postprocedure.  Weight loss medications have been denied by insurance.  She has not tried orlistat.  She is not following a specific diet or exercise routine.  She does report some acid reflux over the last few weeks.  No hemoptysis, cough, voice change, difficulty swallowing, or melena.  She has tried Tums with some relief of symptoms.  Relevant past medical, surgical, family, and social history reviewed and updated as indicated.  Allergies and medications reviewed and updated. Data reviewed: Chart in Epic.   Past Medical History:  Diagnosis Date   Anemia    Anxiety    Graves disease    Graves disease    Obesity     Past Surgical  History:  Procedure Laterality Date   CHOLECYSTECTOMY     GALLBLADDER SURGERY  2008   LAPAROSCOPIC GASTRIC SLEEVE RESECTION     THYROIDECTOMY  06/01/2020   TONSILLECTOMY     TONSILLECTOMY AND ADENOIDECTOMY     WISDOM TOOTH EXTRACTION      Social History   Socioeconomic History   Marital status: Married    Spouse name: Allayne Butcher   Number of children: 3   Years of education: Not on file   Highest education level: Not on file  Occupational History   Not on file  Tobacco Use   Smoking status: Never   Smokeless tobacco: Never  Vaping Use   Vaping Use: Never used  Substance and Sexual Activity   Alcohol use: Not Currently   Drug use: Not Currently   Sexual activity: Yes  Other Topics Concern   Not on file  Social History Narrative   ** Merged History Encounter **       Social Determinants of Health   Financial Resource Strain: Not on file  Food Insecurity: Not on file  Transportation Needs: Not on file  Physical Activity: Not on file  Stress: Not on file  Social Connections: Not on file  Intimate Partner Violence: Not on file    Outpatient Encounter Medications as of 02/22/2021  Medication Sig   calcitRIOL (ROCALTROL) 0.5 MCG capsule Take 1 capsule by mouth in the morning and at bedtime.  famotidine (PEPCID) 20 MG tablet Take 1 tablet (20 mg total) by mouth 2 (two) times daily.   IFEREX 150 150 MG capsule Take 150 mg by mouth 2 (two) times daily.   levothyroxine (SYNTHROID) 200 MCG tablet Take 1 tablet (200 mcg total) by mouth daily.   orlistat (ALLI) 60 MG capsule Take 1 capsule (60 mg total) by mouth 3 (three) times daily with meals.   No facility-administered encounter medications on file as of 02/22/2021.    No Known Allergies  Review of Systems  Constitutional:  Negative for activity change, appetite change, chills, diaphoresis, fatigue, fever and unexpected weight change.  HENT: Negative.    Eyes: Negative.  Negative for photophobia and visual  disturbance.  Respiratory:  Negative for cough, chest tightness and shortness of breath.   Cardiovascular:  Negative for chest pain, palpitations and leg swelling.  Gastrointestinal:  Negative for abdominal distention, abdominal pain, anal bleeding, blood in stool, constipation, diarrhea, nausea, rectal pain and vomiting.       GERD symptoms  Endocrine: Negative.  Negative for cold intolerance, heat intolerance, polydipsia, polyphagia and polyuria.  Genitourinary:  Negative for decreased urine volume, difficulty urinating, dysuria, frequency and urgency.  Musculoskeletal:  Negative for arthralgias and myalgias.  Skin:        Hair loss  Allergic/Immunologic: Negative.   Neurological:  Negative for dizziness, tremors, seizures, syncope, facial asymmetry, speech difficulty, weakness, light-headedness, numbness and headaches.  Hematological: Negative.   Psychiatric/Behavioral:  Negative for confusion, hallucinations, sleep disturbance and suicidal ideas.   All other systems reviewed and are negative.      Objective:  BP 103/74   Pulse 64   Temp 97.8 F (36.6 C)   Ht 5\' 6"  (1.676 m)   Wt 242 lb (109.8 kg)   LMP 01/21/2021 (Approximate)   SpO2 96%   BMI 39.06 kg/m    Wt Readings from Last 3 Encounters:  02/22/21 242 lb (109.8 kg)  11/23/20 237 lb (107.5 kg)  01/08/20 245 lb 8 oz (111.4 kg)    Physical Exam Vitals and nursing note reviewed.  Constitutional:      General: She is not in acute distress.    Appearance: Normal appearance. She is well-developed and well-groomed. She is morbidly obese. She is not ill-appearing, toxic-appearing or diaphoretic.  HENT:     Head: Normocephalic and atraumatic.     Jaw: There is normal jaw occlusion.     Right Ear: Hearing normal.     Left Ear: Hearing normal.     Nose: Nose normal.     Mouth/Throat:     Lips: Pink.     Mouth: Mucous membranes are moist.     Pharynx: Oropharynx is clear. Uvula midline.  Eyes:     General: Lids are  normal.     Extraocular Movements: Extraocular movements intact.     Conjunctiva/sclera: Conjunctivae normal.     Pupils: Pupils are equal, round, and reactive to light.  Neck:     Thyroid: No thyroid mass, thyromegaly or thyroid tenderness.     Vascular: No carotid bruit or JVD.     Trachea: Trachea and phonation normal.  Cardiovascular:     Rate and Rhythm: Normal rate and regular rhythm.     Chest Wall: PMI is not displaced.     Pulses: Normal pulses.     Heart sounds: Normal heart sounds. No murmur heard.   No friction rub. No gallop.  Pulmonary:     Effort: Pulmonary effort is  normal. No respiratory distress.     Breath sounds: Normal breath sounds. No wheezing.  Abdominal:     General: Bowel sounds are normal. There is no distension or abdominal bruit.     Palpations: Abdomen is soft. There is no hepatomegaly or splenomegaly.     Tenderness: There is abdominal tenderness (epigastric). There is no right CVA tenderness or left CVA tenderness.     Hernia: No hernia is present.  Musculoskeletal:        General: Normal range of motion.     Cervical back: Normal range of motion and neck supple.     Right lower leg: No edema.     Left lower leg: No edema.  Lymphadenopathy:     Cervical: No cervical adenopathy.  Skin:    General: Skin is warm and dry.     Capillary Refill: Capillary refill takes less than 2 seconds.     Coloration: Skin is not cyanotic, jaundiced or pale.     Findings: No rash.  Neurological:     General: No focal deficit present.     Mental Status: She is alert and oriented to person, place, and time.     Sensory: Sensation is intact.     Motor: Motor function is intact.     Coordination: Coordination is intact.     Gait: Gait is intact.     Deep Tendon Reflexes: Reflexes are normal and symmetric.  Psychiatric:        Attention and Perception: Attention and perception normal.        Mood and Affect: Mood and affect normal.        Speech: Speech normal.         Behavior: Behavior normal. Behavior is cooperative.        Thought Content: Thought content normal.        Cognition and Memory: Cognition and memory normal.        Judgment: Judgment normal.    Results for orders placed or performed in visit on 02/22/21  Bayer DCA Hb A1c Waived  Result Value Ref Range   HB A1C (BAYER DCA - WAIVED) 4.7 (L) 4.8 - 5.6 %       Pertinent labs & imaging results that were available during my care of the patient were reviewed by me and considered in my medical decision making.  Assessment & Plan:  Jennifer Rosales was seen today for medical management of chronic issues.  Diagnoses and all orders for this visit:  Iron deficiency anemia secondary to inadequate dietary iron intake Labs pending.  Continue iron repletion therapy. -     CBC with Differential/Platelet  Vitamin D deficiency Labs pending. Continue repletion therapy. If indicated, will change repletion dosage. Eat foods rich in Vit D including milk, orange juice, yogurt with vitamin D added, salmon or mackerel, canned tuna fish, cereals with vitamin D added, and cod liver oil. Get out in the sun but make sure to wear at least SPF 30 sunscreen.  -     Vitamin D 1,25 dihydroxy  Morbid (severe) obesity due to excess calories (HCC) Diet and exercise discussed in detail.  We will repeat lipid panel along with CBC and CMP.  Will initiate orlistat.  Follow-up in 3 months for reevaluation. -     Lipid panel -     orlistat (ALLI) 60 MG capsule; Take 1 capsule (60 mg total) by mouth 3 (three) times daily with meals.  Graves disease S/P total thyroidectomy Labs pending.  Will adjust repletion therapy if warranted. -     Thyroid Panel With TSH  Pure hypercholesterolemia Urinalysis pending.  Diet and exercise in detail.  Will initiate statin therapy if warranted. -     Lipid panel  Gastroesophageal reflux disease without esophagitis No red flags concerning for esophagitis or malignancy.  Due to history of  gastric bypass, will refer to GI for possible endoscopy.  Pepcid as prescribed.  Patient aware to report any new, worsening, or persistent symptoms. -     famotidine (PEPCID) 20 MG tablet; Take 1 tablet (20 mg total) by mouth 2 (two) times daily. -     Ambulatory referral to Gastroenterology    Continue all other maintenance medications.  Follow up plan: Return in about 3 months (around 05/25/2021), or if symptoms worsen or fail to improve, for BMI.   Continue healthy lifestyle choices, including diet (rich in fruits, vegetables, and lean proteins, and low in salt and simple carbohydrates) and exercise (at least 30 minutes of moderate physical activity daily).  Educational handout given for calorie counting  The above assessment and management plan was discussed with the patient. The patient verbalized understanding of and has agreed to the management plan. Patient is aware to call the clinic if they develop any new symptoms or if symptoms persist or worsen. Patient is aware when to return to the clinic for a follow-up visit. Patient educated on when it is appropriate to go to the emergency department.   Kari Baars, FNP-C [facility] [phone]

## 2021-02-23 MED ORDER — LEVOTHYROXINE SODIUM 50 MCG PO TABS: 50.0000 ug | ORAL_TABLET | Freq: Every day | ORAL | 3 refills | Status: DC

## 2021-02-23 NOTE — Addendum Note (Signed)
Addended by: Sonny Masters on: 02/23/2021 07:52 AM   Modules accepted: Orders

## 2021-03-02 ENCOUNTER — Ambulatory Visit: Payer: Medicaid Other | Admitting: Family Medicine

## 2021-03-02 LAB — CMP14+EGFR
ALT: 10 IU/L (ref 0–32)
AST: 18 IU/L (ref 0–40)
Albumin/Globulin Ratio: 2.2 (ref 1.2–2.2)
Albumin: 4.4 g/dL (ref 3.8–4.8)
Alkaline Phosphatase: 86 IU/L (ref 44–121)
BUN/Creatinine Ratio: 20 (ref 9–23)
BUN: 19 mg/dL (ref 6–20)
Bilirubin Total: 0.4 mg/dL (ref 0.0–1.2)
CO2: 25 mmol/L (ref 20–29)
Calcium: 8 mg/dL — ABNORMAL LOW (ref 8.7–10.2)
Chloride: 103 mmol/L (ref 96–106)
Creatinine, Ser: 0.93 mg/dL (ref 0.57–1.00)
Globulin, Total: 2 g/dL (ref 1.5–4.5)
Glucose: 78 mg/dL (ref 70–99)
Potassium: 4 mmol/L (ref 3.5–5.2)
Sodium: 140 mmol/L (ref 134–144)
Total Protein: 6.4 g/dL (ref 6.0–8.5)
eGFR: 83 mL/min/{1.73_m2} (ref >59)

## 2021-03-02 LAB — THYROID PANEL WITH TSH
Free Thyroxine Index: 0.7 — ABNORMAL LOW (ref 1.2–4.9)
T3 Uptake Ratio: 21 % — ABNORMAL LOW (ref 24–39)
T4, Total: 3.5 ug/dL — ABNORMAL LOW (ref 4.5–12.0)
TSH: 73.2 u[IU]/mL — ABNORMAL HIGH (ref 0.450–4.500)

## 2021-03-02 LAB — CBC WITH DIFFERENTIAL/PLATELET
Basophils Absolute: 0 10*3/uL (ref 0.0–0.2)
Basos: 1 %
EOS (ABSOLUTE): 0.2 10*3/uL (ref 0.0–0.4)
Eos: 3 %
Hematocrit: 42.7 % (ref 34.0–46.6)
Hemoglobin: 14.5 g/dL (ref 11.1–15.9)
Immature Grans (Abs): 0 10*3/uL (ref 0.0–0.1)
Immature Granulocytes: 0 %
Lymphocytes Absolute: 2.2 10*3/uL (ref 0.7–3.1)
Lymphs: 32 %
MCH: 29.8 pg (ref 26.6–33.0)
MCHC: 34 g/dL (ref 31.5–35.7)
MCV: 88 fL (ref 79–97)
Monocytes Absolute: 0.4 10*3/uL (ref 0.1–0.9)
Monocytes: 6 %
Neutrophils Absolute: 4.1 10*3/uL (ref 1.4–7.0)
Neutrophils: 58 %
Platelets: 202 10*3/uL (ref 150–450)
RBC: 4.87 x10E6/uL (ref 3.77–5.28)
RDW: 12.7 % (ref 11.7–15.4)
WBC: 7 10*3/uL (ref 3.4–10.8)

## 2021-03-02 LAB — LIPID PANEL
Chol/HDL Ratio: 3.4 ratio (ref 0.0–4.4)
Cholesterol, Total: 212 mg/dL — ABNORMAL HIGH (ref 100–199)
HDL: 62 mg/dL (ref >39)
LDL Chol Calc (NIH): 139 mg/dL — ABNORMAL HIGH (ref 0–99)
Triglycerides: 62 mg/dL (ref 0–149)
VLDL Cholesterol Cal: 11 mg/dL (ref 5–40)

## 2021-03-02 LAB — VITAMIN D 1,25 DIHYDROXY
Vitamin D 1, 25 (OH)2 Total: 28 pg/mL
Vitamin D2 1, 25 (OH)2: <10 pg/mL
Vitamin D3 1, 25 (OH)2: 27 pg/mL

## 2021-03-08 ENCOUNTER — Ambulatory Visit: Payer: Medicaid Other | Admitting: Pharmacist

## 2021-03-09 DIAGNOSIS — R635 Abnormal weight gain: Secondary | ICD-10-CM | POA: Diagnosis not present

## 2021-03-09 DIAGNOSIS — R1011 Right upper quadrant pain: Secondary | ICD-10-CM | POA: Diagnosis not present

## 2021-03-09 DIAGNOSIS — K589 Irritable bowel syndrome without diarrhea: Secondary | ICD-10-CM | POA: Diagnosis not present

## 2021-03-09 DIAGNOSIS — R1031 Right lower quadrant pain: Secondary | ICD-10-CM | POA: Diagnosis not present

## 2021-04-07 DIAGNOSIS — R109 Unspecified abdominal pain: Secondary | ICD-10-CM | POA: Diagnosis not present

## 2021-04-07 DIAGNOSIS — R1031 Right lower quadrant pain: Secondary | ICD-10-CM | POA: Diagnosis not present

## 2021-04-07 DIAGNOSIS — R1011 Right upper quadrant pain: Secondary | ICD-10-CM | POA: Diagnosis not present

## 2021-04-07 DIAGNOSIS — Z9049 Acquired absence of other specified parts of digestive tract: Secondary | ICD-10-CM | POA: Diagnosis not present

## 2021-04-18 DIAGNOSIS — K449 Diaphragmatic hernia without obstruction or gangrene: Secondary | ICD-10-CM | POA: Diagnosis not present

## 2021-04-18 DIAGNOSIS — K2950 Unspecified chronic gastritis without bleeding: Secondary | ICD-10-CM | POA: Diagnosis not present

## 2021-04-18 DIAGNOSIS — K3189 Other diseases of stomach and duodenum: Secondary | ICD-10-CM | POA: Diagnosis not present

## 2021-04-18 DIAGNOSIS — K219 Gastro-esophageal reflux disease without esophagitis: Secondary | ICD-10-CM | POA: Diagnosis not present

## 2021-05-11 ENCOUNTER — Encounter: Payer: Self-pay | Admitting: Family Medicine

## 2021-05-25 ENCOUNTER — Encounter: Payer: Self-pay | Admitting: Family Medicine

## 2021-05-25 ENCOUNTER — Ambulatory Visit: Payer: Medicaid Other | Admitting: Family Medicine

## 2021-05-25 VITALS — BP 102/69 | HR 70 | Temp 97.4°F | Ht 66.0 in | Wt 245.0 lb

## 2021-05-25 DIAGNOSIS — E0500 Thyrotoxicosis with diffuse goiter without thyrotoxic crisis or storm: Secondary | ICD-10-CM

## 2021-05-25 DIAGNOSIS — Z862 Personal history of diseases of the blood and blood-forming organs and certain disorders involving the immune mechanism: Secondary | ICD-10-CM

## 2021-05-25 DIAGNOSIS — R202 Paresthesia of skin: Secondary | ICD-10-CM

## 2021-05-25 DIAGNOSIS — E890 Postprocedural hypothyroidism: Secondary | ICD-10-CM | POA: Diagnosis not present

## 2021-05-25 DIAGNOSIS — G250 Essential tremor: Secondary | ICD-10-CM | POA: Diagnosis not present

## 2021-05-25 NOTE — Progress Notes (Addendum)
Subjective:  Patient ID: Jennifer Rosales, female    DOB: Oct 30, 1985, 36 y.o.   MRN: [id number]  Patient Care Team: Baruch Gouty, FNP as PCP - General (Family Medicine) Christophe Louis, MD (Obstetrics and Gynecology) Lavera Guise, [facility] as Pharmacist (Family Medicine)   Chief Complaint:  Obesity and Hypothyroidism   HPI: Jennifer Rosales is a 36 y.o. female presenting on 05/25/2021 for Obesity and Hypothyroidism   Pt presents for a follow up of Graves disease. She has not seen endocrinology since her last appointment and does not have an appointment scheduled. She states she feels as though her Calcium is decreased. She endorses tingling in the hands radiating up her arms and in her lip.     Relevant past medical, surgical, family, and social history reviewed and updated as indicated.  Allergies and medications reviewed and updated. Data reviewed: Chart in Epic.   Past Medical History:  Diagnosis Date   Anemia    Anxiety    Graves disease    Graves disease    Obesity     Past Surgical History:  Procedure Laterality Date   CHOLECYSTECTOMY     GALLBLADDER SURGERY  2008   LAPAROSCOPIC GASTRIC SLEEVE RESECTION     THYROIDECTOMY  06/01/2020   TONSILLECTOMY     TONSILLECTOMY AND ADENOIDECTOMY     WISDOM TOOTH EXTRACTION      Social History   Socioeconomic History   Marital status: Married    Spouse name: Regina Eck   Number of children: 3   Years of education: Not on file   Highest education level: Not on file  Occupational History   Not on file  Tobacco Use   Smoking status: Never   Smokeless tobacco: Never  Vaping Use   Vaping Use: Never used  Substance and Sexual Activity   Alcohol use: Not Currently   Drug use: Not Currently   Sexual activity: Yes  Other Topics Concern   Not on file  Social History Narrative   ** Merged History Encounter **       Social Determinants of Health   Financial Resource Strain: Not on file  Food Insecurity: Not on  file  Transportation Needs: Not on file  Physical Activity: Not on file  Stress: Not on file  Social Connections: Not on file  Intimate Partner Violence: Not on file    Outpatient Encounter Medications as of 05/25/2021  Medication Sig   calcitRIOL (ROCALTROL) 0.5 MCG capsule Take 1 capsule by mouth in the morning and at bedtime.   famotidine (PEPCID) 20 MG tablet Take 1 tablet (20 mg total) by mouth 2 (two) times daily.   IFEREX 150 150 MG capsule Take 150 mg by mouth 2 (two) times daily.   levothyroxine (SYNTHROID) 200 MCG tablet Take 1 tablet (200 mcg total) by mouth daily.   levothyroxine (SYNTHROID) 50 MCG tablet Take 1 tablet (50 mcg total) by mouth daily.   [DISCONTINUED] orlistat (ALLI) 60 MG capsule Take 1 capsule (60 mg total) by mouth 3 (three) times daily with meals.   No facility-administered encounter medications on file as of 05/25/2021.    No Known Allergies  Review of Systems  Constitutional:  Positive for fatigue and unexpected weight change. Negative for activity change, appetite change, chills, diaphoresis and fever.  Respiratory:  Negative for cough and shortness of breath.   Cardiovascular:  Negative for chest pain, palpitations and leg swelling.  Gastrointestinal:  Negative for abdominal pain.  Endocrine: Positive for polyphagia. Negative for cold intolerance, heat intolerance, polydipsia and polyuria.  Genitourinary:  Negative for decreased urine volume and difficulty urinating.  Neurological:  Positive for tremors. Negative for dizziness, seizures, syncope, facial asymmetry, speech difficulty, weakness, light-headedness, numbness and headaches.       Parasthesias  All other systems reviewed and are negative.      Objective:  BP 102/69    Pulse 70    Temp (!) 97.4 F (36.3 C) (Temporal)    Ht 5' 6"  (1.676 m)    Wt 111.1 kg    BMI 39.54 kg/m    Wt Readings from Last 3 Encounters:  05/25/21 111.1 kg  02/22/21 109.8 kg  11/23/20 107.5 kg    Physical  Exam Vitals and nursing note reviewed.  Constitutional:      Appearance: She is obese.  HENT:     Head: Normocephalic and atraumatic.  Eyes:     Conjunctiva/sclera: Conjunctivae normal.     Pupils: Pupils are equal, round, and reactive to light.  Cardiovascular:     Rate and Rhythm: Normal rate and regular rhythm.     Heart sounds: Normal heart sounds.  Pulmonary:     Effort: Pulmonary effort is normal.     Breath sounds: Normal breath sounds.  Musculoskeletal:     Cervical back: Normal range of motion and neck supple.     Right lower leg: No edema.     Left lower leg: No edema.  Skin:    General: Skin is warm and dry.     Capillary Refill: Capillary refill takes less than 2 seconds.  Neurological:     General: No focal deficit present.     Mental Status: She is alert and oriented to person, place, and time. Mental status is at baseline.     Comments: Essential tremor   Psychiatric:        Mood and Affect: Mood normal.        Behavior: Behavior normal.        Thought Content: Thought content normal.        Judgment: Judgment normal.    Results for orders placed or performed in visit on 02/22/21  Bayer DCA Hb A1c Waived  Result Value Ref Range   HB A1C (BAYER DCA - WAIVED) 4.7 (L) 4.8 - 5.6 %  Lipid panel  Result Value Ref Range   Cholesterol, Total 212 (H) 100 - 199 mg/dL   Triglycerides 62 0 - 149 mg/dL   HDL 62 >39 mg/dL   VLDL Cholesterol Cal 11 5 - 40 mg/dL   LDL Chol Calc (NIH) 139 (H) 0 - 99 mg/dL   Chol/HDL Ratio 3.4 0.0 - 4.4 ratio  Thyroid Panel With TSH  Result Value Ref Range   TSH 73.200 (H) 0.450 - 4.500 uIU/mL   T4, Total 3.5 (L) 4.5 - 12.0 ug/dL   T3 Uptake Ratio 21 (L) 24 - 39 %   Free Thyroxine Index 0.7 (L) 1.2 - 4.9  CMP14+EGFR  Result Value Ref Range   Glucose 78 70 - 99 mg/dL   BUN 19 6 - 20 mg/dL   Creatinine, Ser 0.93 0.57 - 1.00 mg/dL   eGFR 83 >59 mL/min/1.73   BUN/Creatinine Ratio 20 9 - 23   Sodium 140 134 - 144 mmol/L   Potassium  4.0 3.5 - 5.2 mmol/L   Chloride 103 96 - 106 mmol/L   CO2 25 20 - 29 mmol/L   Calcium 8.0 (L) 8.7 -  10.2 mg/dL   Total Protein 6.4 6.0 - 8.5 g/dL   Albumin 4.4 3.8 - 4.8 g/dL   Globulin, Total 2.0 1.5 - 4.5 g/dL   Albumin/Globulin Ratio 2.2 1.2 - 2.2   Bilirubin Total 0.4 0.0 - 1.2 mg/dL   Alkaline Phosphatase 86 44 - 121 IU/L   AST 18 0 - 40 IU/L   ALT 10 0 - 32 IU/L  CBC with Differential/Platelet  Result Value Ref Range   WBC 7.0 3.4 - 10.8 x10E3/uL   RBC 4.87 3.77 - 5.28 x10E6/uL   Hemoglobin 14.5 11.1 - 15.9 g/dL   Hematocrit 42.7 34.0 - 46.6 %   MCV 88 79 - 97 fL   MCH 29.8 26.6 - 33.0 pg   MCHC 34.0 31.5 - 35.7 g/dL   RDW 12.7 11.7 - 15.4 %   Platelets 202 150 - 450 x10E3/uL   Neutrophils 58 Not Estab. %   Lymphs 32 Not Estab. %   Monocytes 6 Not Estab. %   Eos 3 Not Estab. %   Basos 1 Not Estab. %   Neutrophils Absolute 4.1 1.4 - 7.0 x10E3/uL   Lymphocytes Absolute 2.2 0.7 - 3.1 x10E3/uL   Monocytes Absolute 0.4 0.1 - 0.9 x10E3/uL   EOS (ABSOLUTE) 0.2 0.0 - 0.4 x10E3/uL   Basophils Absolute 0.0 0.0 - 0.2 x10E3/uL   Immature Granulocytes 0 Not Estab. %   Immature Grans (Abs) 0.0 0.0 - 0.1 x10E3/uL  Vitamin D 1,25 dihydroxy  Result Value Ref Range   Vitamin D 1, 25 (OH)2 Total 28 pg/mL   Vitamin D2 1, 25 (OH)2 <10 pg/mL   Vitamin D3 1, 25 (OH)2 27 pg/mL       Pertinent labs & imaging results that were available during my care of the patient were reviewed by me and considered in my medical decision making.  Assessment & Plan:  There are no diagnoses linked to this encounter.   Continue all other maintenance medications. Perl was seen today for obesity and hypothyroidism.  Diagnoses and all orders for this visit:  Graves disease -     Thyroid Panel With TSH S/P total thyroidectomy -     Thyroid Panel With TSH -     Basic Metabolic Panel -     Ambulatory referral to Endocrinology Morbid (severe) obesity due to excess calories ([place]) Essential  tremor -Resent endocrinology referral. Encouraged patient to follow up.  Paresthesias -     Vitamin B12 -     Iron, TIBC and Ferritin Panel - BMP - Will evaluate BMP for assessment of Calcium levels and adjust regiment as needed.   History of iron deficiency anemia -     CBC with Differential -     Iron, TIBC and Ferritin Panel - Due to history of iron deficiency anemia and current fatigue, will investigate iron labs with CBC.    Follow up plan: Return if symptoms worsen or fail to improve.   Continue healthy lifestyle choices, including diet (rich in fruits, vegetables, and lean proteins, and low in salt and simple carbohydrates) and exercise (at least 30 minutes of moderate physical activity daily).  Educational handout given for thyroidectomy   The above assessment and management plan was discussed with the patient. The patient verbalized understanding of and has agreed to the management plan. Patient is aware to call the clinic if they develop any new symptoms or if symptoms persist or worsen. Patient is aware when to return to the clinic for a follow-up  visit. Patient educated on when it is appropriate to go to the emergency department.   Addison Joy Reiger, NP-S  I personally was present during the history, physical exam, and medical decision-making activities of this visit and have verified that the services and findings are accurately documented in the nurse practitioner student's note.  Monia Pouch, FNP-C [facility] [address] [place], [place] [address] [phone]

## 2021-05-26 LAB — CBC WITH DIFFERENTIAL/PLATELET
Basophils Absolute: 0 10*3/uL (ref 0.0–0.2)
Basos: 1 %
EOS (ABSOLUTE): 0.1 10*3/uL (ref 0.0–0.4)
Eos: 1 %
Hematocrit: 43.4 % (ref 34.0–46.6)
Hemoglobin: 14.5 g/dL (ref 11.1–15.9)
Immature Grans (Abs): 0 10*3/uL (ref 0.0–0.1)
Immature Granulocytes: 0 %
Lymphocytes Absolute: 2.2 10*3/uL (ref 0.7–3.1)
Lymphs: 33 %
MCH: 29.8 pg (ref 26.6–33.0)
MCHC: 33.4 g/dL (ref 31.5–35.7)
MCV: 89 fL (ref 79–97)
Monocytes Absolute: 0.3 10*3/uL (ref 0.1–0.9)
Monocytes: 5 %
Neutrophils Absolute: 3.9 10*3/uL (ref 1.4–7.0)
Neutrophils: 60 %
Platelets: 231 10*3/uL (ref 150–450)
RBC: 4.86 x10E6/uL (ref 3.77–5.28)
RDW: 12.4 % (ref 11.7–15.4)
WBC: 6.5 10*3/uL (ref 3.4–10.8)

## 2021-05-26 LAB — IRON,TIBC AND FERRITIN PANEL
Ferritin: 51 ng/mL (ref 15–150)
Iron Saturation: 30 % (ref 15–55)
Iron: 76 ug/dL (ref 27–159)
Total Iron Binding Capacity: 251 ug/dL (ref 250–450)
UIBC: 175 ug/dL (ref 131–425)

## 2021-05-26 LAB — THYROID PANEL WITH TSH
Free Thyroxine Index: 2.2 (ref 1.2–4.9)
T3 Uptake Ratio: 29 % (ref 24–39)
T4, Total: 7.6 ug/dL (ref 4.5–12.0)
TSH: 30.5 u[IU]/mL — ABNORMAL HIGH (ref 0.450–4.500)

## 2021-05-26 LAB — BASIC METABOLIC PANEL
BUN/Creatinine Ratio: 19 (ref 9–23)
BUN: 15 mg/dL (ref 6–20)
CO2: 25 mmol/L (ref 20–29)
Calcium: 8.3 mg/dL — ABNORMAL LOW (ref 8.7–10.2)
Chloride: 101 mmol/L (ref 96–106)
Creatinine, Ser: 0.8 mg/dL (ref 0.57–1.00)
Glucose: 75 mg/dL (ref 70–99)
Potassium: 4 mmol/L (ref 3.5–5.2)
Sodium: 138 mmol/L (ref 134–144)
eGFR: 99 mL/min/{1.73_m2} (ref >59)

## 2021-05-26 LAB — VITAMIN B12: Vitamin B-12: 550 pg/mL (ref 232–1245)

## 2021-06-20 DIAGNOSIS — E890 Postprocedural hypothyroidism: Secondary | ICD-10-CM | POA: Diagnosis not present

## 2021-06-20 DIAGNOSIS — E0500 Thyrotoxicosis with diffuse goiter without thyrotoxic crisis or storm: Secondary | ICD-10-CM | POA: Diagnosis not present

## 2021-08-01 DIAGNOSIS — E890 Postprocedural hypothyroidism: Secondary | ICD-10-CM | POA: Diagnosis not present

## 2021-08-01 DIAGNOSIS — E0500 Thyrotoxicosis with diffuse goiter without thyrotoxic crisis or storm: Secondary | ICD-10-CM | POA: Diagnosis not present

## 2021-08-12 DIAGNOSIS — K912 Postsurgical malabsorption, not elsewhere classified: Secondary | ICD-10-CM | POA: Diagnosis not present

## 2021-08-12 DIAGNOSIS — Z903 Acquired absence of stomach [part of]: Secondary | ICD-10-CM | POA: Diagnosis not present

## 2021-08-17 ENCOUNTER — Ambulatory Visit: Payer: Medicaid Other | Admitting: Family Medicine

## 2021-08-17 ENCOUNTER — Encounter: Payer: Self-pay | Admitting: Family Medicine

## 2021-08-17 VITALS — BP 94/66 | HR 73 | Temp 97.3°F | Ht 66.0 in | Wt 249.0 lb

## 2021-08-17 DIAGNOSIS — E0500 Thyrotoxicosis with diffuse goiter without thyrotoxic crisis or storm: Secondary | ICD-10-CM

## 2021-08-17 DIAGNOSIS — F411 Generalized anxiety disorder: Secondary | ICD-10-CM | POA: Diagnosis not present

## 2021-08-17 DIAGNOSIS — I83893 Varicose veins of bilateral lower extremities with other complications: Secondary | ICD-10-CM

## 2021-08-17 DIAGNOSIS — E890 Postprocedural hypothyroidism: Secondary | ICD-10-CM | POA: Diagnosis not present

## 2021-08-17 DIAGNOSIS — F320 Major depressive disorder, single episode, mild: Secondary | ICD-10-CM | POA: Diagnosis not present

## 2021-08-17 HISTORY — DX: Major depressive disorder, single episode, mild: F32.0

## 2021-08-17 MED ORDER — FLUOXETINE HCL 20 MG PO CAPS: 20.0000 mg | ORAL_CAPSULE | Freq: Every day | ORAL | 3 refills | Status: DC

## 2021-08-17 NOTE — Progress Notes (Signed)
?  ? ?Subjective:  ?Patient ID: Jennifer OxfordLindsey J Rosales, female    DOB: Jan 14, 1986, 36 y.o.   MRN: [id number] ? ?Patient Care Team: ?Sonny Mastersakes, Elaya Droege M, FNP as PCP - General (Family Medicine) ?Gerald Leitzole, Tara, MD (Obstetrics and Gynecology) ?Danella MaiersPruitt, Julie D, [facility] as Pharmacist (Family Medicine)  ? ?Chief Complaint:  Medical Management of Chronic Issues ? ? ?HPI: ?Jennifer OxfordLindsey J Rosales is a 36 y.o. female presenting on 08/17/2021 for Medical Management of Chronic Issues ? ?Pt presents today for follow up. She has hypothyroidism s/p thyroidectomy. She was referred to new endocrinologist at last visit. He changed her brand synthroid to levothyroxine. She has been taking 250 mcg daily but still has an elevated TSH. She has a follow up with endo for repeat labs next months. She does report ongoing fatigue and is starting to have issues with anxiety, depression, agitation, and mood swings. She states this started about 3 months ago and does not seem to be improving. She has not been on medications in the past for anxiety and depression but feels her symptoms are starting to interfere with her daily life and family life. No SI or HI.  ?She was seen by Dr. Adolphus Birchwoodasher for morbid obesity and has an upcoming visit with his PA to discuss further management of obesity. She has made dietary changes but is unable to drop weight due to uncontrolled hypothyroidism.  ?She states over the last few months she has noticed multiple varicose veins in her legs that are rupturing under the skin and causing pain and bruising. She has not had this evaluated in the past.  ? ? ?  08/17/2021  ? 10:31 AM 05/25/2021  ? 10:14 AM 02/22/2021  ? 10:23 AM 11/23/2020  ? 10:09 AM  ?GAD 7 : Generalized Anxiety Score  ?Nervous, Anxious, on Edge 0 1 2 2   ?Control/stop worrying 2 0 0 0  ?Worry too much - different things 0 1 0 0  ?Trouble relaxing 3 1 2 2   ?Restless 2 0 2 2  ?Easily annoyed or irritable 3 2 2 3   ?Afraid - awful might happen 2 2 0 1  ?Total GAD 7 Score 12 7 8 10   ?Anxiety  Difficulty Very difficult Not difficult at all Not difficult at all Somewhat difficult  ? ? ? ?  08/17/2021  ? 10:31 AM 05/25/2021  ? 10:13 AM 02/22/2021  ? 10:22 AM 11/23/2020  ? 10:08 AM  ?Depression screen PHQ 2/9  ?Decreased Interest 0 0 0 0  ?Down, Depressed, Hopeless 0 0 0 0  ?PHQ - 2 Score 0 0 0 0  ?Altered sleeping 2 1 2  0  ?Tired, decreased energy 2 1 2 2   ?Change in appetite 2 2 2 2   ?Feeling bad or failure about yourself  0 1 0 0  ?Trouble concentrating 3 0 0 0  ?Moving slowly or fidgety/restless 2 0 0 0  ?Suicidal thoughts 0 0 0 0  ?PHQ-9 Score 11 5 6 4   ?Difficult doing work/chores Very difficult Somewhat difficult Not difficult at all Somewhat difficult  ? ? ? ?Relevant past medical, surgical, family, and social history reviewed and updated as indicated.  ?Allergies and medications reviewed and updated. Data reviewed: Chart in Epic. ? ? ?Past Medical History:  ?Diagnosis Date  ? Anemia   ? Anxiety   ? Depression, major, single episode, mild (HCC) 08/17/2021  ? Graves disease   ? Graves disease   ? Obesity   ? ? ?Past Surgical History:  ?Procedure  Laterality Date  ? CHOLECYSTECTOMY    ? GALLBLADDER SURGERY  2008  ? LAPAROSCOPIC GASTRIC SLEEVE RESECTION    ? THYROIDECTOMY  06/01/2020  ? TONSILLECTOMY    ? TONSILLECTOMY AND ADENOIDECTOMY    ? WISDOM TOOTH EXTRACTION    ? ? ?Social History  ? ?Socioeconomic History  ? Marital status: Married  ?  Spouse name: Allayne Butcher  ? Number of children: 3  ? Years of education: Not on file  ? Highest education level: Not on file  ?Occupational History  ? Not on file  ?Tobacco Use  ? Smoking status: Never  ? Smokeless tobacco: Never  ?Vaping Use  ? Vaping Use: Never used  ?Substance and Sexual Activity  ? Alcohol use: Not Currently  ? Drug use: Not Currently  ? Sexual activity: Yes  ?Other Topics Concern  ? Not on file  ?Social History Narrative  ? ** Merged History Encounter **  ?    ? ?Social Determinants of Health  ? ?Financial Resource Strain: Not on file  ?Food  Insecurity: Not on file  ?Transportation Needs: Not on file  ?Physical Activity: Not on file  ?Stress: Not on file  ?Social Connections: Not on file  ?Intimate Partner Violence: Not on file  ? ? ?Outpatient Encounter Medications as of 08/17/2021  ?Medication Sig  ? calcitRIOL (ROCALTROL) 0.5 MCG capsule Take 1 capsule by mouth in the morning and at bedtime.  ? FLUoxetine (PROZAC) 20 MG capsule Take 1 capsule (20 mg total) by mouth daily.  ? IFEREX 150 150 MG capsule Take 150 mg by mouth 2 (two) times daily.  ? levothyroxine (SYNTHROID) 200 MCG tablet Take 1 tablet (200 mcg total) by mouth daily.  ? levothyroxine (SYNTHROID) 50 MCG tablet Take 1 tablet (50 mcg total) by mouth daily.  ? famotidine (PEPCID) 20 MG tablet Take 1 tablet (20 mg total) by mouth 2 (two) times daily.  ? ?No facility-administered encounter medications on file as of 08/17/2021.  ? ? ?No Known Allergies ? ?Review of Systems  ?Constitutional:  Positive for activity change, appetite change and fatigue. Negative for chills, diaphoresis, fever and unexpected weight change.  ?HENT: Negative.    ?Eyes: Negative.   ?Respiratory:  Negative for cough, chest tightness and shortness of breath.   ?Cardiovascular:  Positive for leg swelling. Negative for chest pain and palpitations.  ?Gastrointestinal:  Negative for abdominal pain, blood in stool, constipation, diarrhea, nausea and vomiting.  ?Endocrine: Negative.   ?Genitourinary:  Negative for decreased urine volume, difficulty urinating, dysuria, frequency and urgency.  ?Musculoskeletal:  Negative for arthralgias and myalgias.  ?Skin: Negative.   ?Allergic/Immunologic: Negative.   ?Neurological:  Negative for dizziness and headaches.  ?Hematological:  Bruises/bleeds easily.  ?Psychiatric/Behavioral:  Positive for agitation, decreased concentration, dysphoric mood and sleep disturbance. Negative for behavioral problems, confusion, hallucinations, self-injury and suicidal ideas. The patient is  nervous/anxious. The patient is not hyperactive.   ?All other systems reviewed and are negative. ? ?   ? ?Objective:  ?BP 94/66   Pulse 73   Temp (!) 97.3 ?F (36.3 ?C)   Ht 5\' 6"  (1.676 m)   Wt 249 lb (112.9 kg)   LMP 08/17/2021 (Exact Date)   SpO2 99%   BMI 40.19 kg/m?   ? ?Wt Readings from Last 3 Encounters:  ?08/17/21 249 lb (112.9 kg)  ?05/25/21 245 lb (111.1 kg)  ?02/22/21 242 lb (109.8 kg)  ? ? ?Physical Exam ?Vitals and nursing note reviewed.  ?Constitutional:   ?  General: She is not in acute distress. ?   Appearance: Normal appearance. She is well-developed and well-groomed. She is morbidly obese. She is not ill-appearing, toxic-appearing or diaphoretic.  ?HENT:  ?   Head: Normocephalic and atraumatic.  ?   Jaw: There is normal jaw occlusion.  ?   Right Ear: Hearing normal.  ?   Left Ear: Hearing normal.  ?   Nose: Nose normal.  ?   Mouth/Throat:  ?   Lips: Pink.  ?   Mouth: Mucous membranes are moist.  ?   Pharynx: Oropharynx is clear. Uvula midline.  ?Eyes:  ?   General: Lids are normal.  ?   Pupils: Pupils are equal, round, and reactive to light.  ?Neck:  ?   Vascular: No carotid bruit or JVD.  ?   Trachea: Trachea and phonation normal.  ?   Comments: Thyroidectomy scar ?Cardiovascular:  ?   Rate and Rhythm: Normal rate and regular rhythm.  ?   Chest Wall: PMI is not displaced.  ?   Pulses: Normal pulses.  ?   Heart sounds: Normal heart sounds. No murmur heard. ?  No friction rub. No gallop.  ?   Comments: Bilateral lower extremity varicose veins and spider veins with rupture/bruising of two clusters of spider veins.  ?Pulmonary:  ?   Effort: Pulmonary effort is normal.  ?   Breath sounds: Normal breath sounds.  ?Abdominal:  ?   General: Bowel sounds are normal. There is no abdominal bruit.  ?   Palpations: Abdomen is soft. There is no hepatomegaly or splenomegaly.  ?Musculoskeletal:     ?   General: Normal range of motion.  ?   Cervical back: Normal range of motion and neck supple.  ?   Right  lower leg: No edema.  ?   Left lower leg: No edema.  ?Lymphadenopathy:  ?   Cervical: No cervical adenopathy.  ?Skin: ?   General: Skin is warm and dry.  ?   Capillary Refill: Capillary refill takes less than 2 seconds.  ?   Col

## 2021-08-23 ENCOUNTER — Ambulatory Visit: Payer: Medicaid Other | Admitting: Family Medicine

## 2021-09-12 DIAGNOSIS — E890 Postprocedural hypothyroidism: Secondary | ICD-10-CM | POA: Diagnosis not present

## 2021-09-15 ENCOUNTER — Encounter: Payer: Self-pay | Admitting: Family Medicine

## 2021-09-15 ENCOUNTER — Ambulatory Visit: Payer: Medicaid Other | Admitting: Family Medicine

## 2021-09-15 VITALS — BP 110/76 | HR 69 | Temp 98.6°F | Ht 66.0 in | Wt 246.0 lb

## 2021-09-15 DIAGNOSIS — F320 Major depressive disorder, single episode, mild: Secondary | ICD-10-CM

## 2021-09-15 DIAGNOSIS — F411 Generalized anxiety disorder: Secondary | ICD-10-CM

## 2021-09-15 MED ORDER — FLUOXETINE HCL 40 MG PO CAPS: 40.0000 mg | ORAL_CAPSULE | Freq: Every day | ORAL | 3 refills | Status: DC

## 2021-09-15 NOTE — Patient Instructions (Signed)

## 2021-09-15 NOTE — Progress Notes (Signed)
Subjective:  Patient ID: Jennifer Rosales, female    DOB: 02-13-1986, 36 y.o.   MRN: [id number]  Patient Care Team: Baruch Gouty, FNP as PCP - General (Family Medicine) Christophe Louis, MD (Obstetrics and Gynecology) Lavera Guise, [facility] as Pharmacist (Family Medicine)   Chief Complaint:  Medical Management of Chronic Issues (Anxiety, depression)   HPI: Jennifer Rosales is a 36 y.o. female presenting on 09/15/2021 for Medical Management of Chronic Issues (Anxiety, depression)   1. Depression, major, single episode, mild ([place]) 2. GAD (generalized anxiety disorder) Pt has been taking fluoxetine as prescribed and reports her depression and anxiety symptoms have improved but are still present. She states she is not crying as much and feels symptoms are not interfering with daily life as much as before. She denies SI or HI. No side effects from medications.     09/15/2021   10:25 AM 08/17/2021   10:31 AM 05/25/2021   10:14 AM 02/22/2021   10:23 AM  GAD 7 : Generalized Anxiety Score  Nervous, Anxious, on Edge 1 0 1 2  Control/stop worrying 1 2 0 0  Worry too much - different things 0 0 1 0  Trouble relaxing 1 3 1 2   Restless 1 2 0 2  Easily annoyed or irritable 2 3 2 2   Afraid - awful might happen 1 2 2  0  Total GAD 7 Score 7 12 7 8   Anxiety Difficulty  Very difficult Not difficult at all Not difficult at all       09/15/2021   10:24 AM 08/17/2021   10:31 AM 05/25/2021   10:13 AM 02/22/2021   10:22 AM 11/23/2020   10:08 AM  Depression screen PHQ 2/9  Decreased Interest 0 0 0 0 0  Down, Depressed, Hopeless 0 0 0 0 0  PHQ - 2 Score 0 0 0 0 0  Altered sleeping 1 2 1 2  0  Tired, decreased energy 2 2 1 2 2   Change in appetite 1 2 2 2 2   Feeling bad or failure about yourself  0 0 1 0 0  Trouble concentrating 1 3 0 0 0  Moving slowly or fidgety/restless 0 2 0 0 0  Suicidal thoughts 0 0 0 0 0  PHQ-9 Score 5 11 5 6 4   Difficult doing work/chores Somewhat difficult Very difficult  Somewhat difficult Not difficult at all Somewhat difficult        Relevant past medical, surgical, family, and social history reviewed and updated as indicated.  Allergies and medications reviewed and updated. Data reviewed: Chart in Epic.   Past Medical History:  Diagnosis Date   Anemia    Anxiety    Depression, major, single episode, mild ([place]) 08/17/2021   Graves disease    Graves disease    Obesity     Past Surgical History:  Procedure Laterality Date   CHOLECYSTECTOMY     GALLBLADDER SURGERY  2008   LAPAROSCOPIC GASTRIC SLEEVE RESECTION     THYROIDECTOMY  06/01/2020   TONSILLECTOMY     TONSILLECTOMY AND ADENOIDECTOMY     WISDOM TOOTH EXTRACTION      Social History   Socioeconomic History   Marital status: Married    Spouse name: Regina Eck   Number of children: 3   Years of education: Not on file   Highest education level: Not on file  Occupational History   Not on file  Tobacco Use   Smoking status: Never  Smokeless tobacco: Never  Vaping Use   Vaping Use: Never used  Substance and Sexual Activity   Alcohol use: Not Currently   Drug use: Not Currently   Sexual activity: Yes  Other Topics Concern   Not on file  Social History Narrative   ** Merged History Encounter **       Social Determinants of Health   Financial Resource Strain: Not on file  Food Insecurity: Not on file  Transportation Needs: Not on file  Physical Activity: Not on file  Stress: Not on file  Social Connections: Not on file  Intimate Partner Violence: Not on file    Outpatient Encounter Medications as of 09/15/2021  Medication Sig   calcitRIOL (ROCALTROL) 0.5 MCG capsule Take 1 capsule by mouth in the morning and at bedtime.   FLUoxetine (PROZAC) 40 MG capsule Take 1 capsule (40 mg total) by mouth daily.   IFEREX 150 150 MG capsule Take 150 mg by mouth 2 (two) times daily.   levothyroxine (SYNTHROID) 200 MCG tablet Take 1 tablet (200 mcg total) by mouth daily.    levothyroxine (SYNTHROID) 50 MCG tablet Take 1 tablet (50 mcg total) by mouth daily.   [DISCONTINUED] FLUoxetine (PROZAC) 20 MG capsule Take 1 capsule (20 mg total) by mouth daily.   famotidine (PEPCID) 20 MG tablet Take 1 tablet (20 mg total) by mouth 2 (two) times daily.   No facility-administered encounter medications on file as of 09/15/2021.    No Known Allergies  Review of Systems  Constitutional:  Positive for activity change, appetite change and fatigue.  Respiratory:  Negative for cough and shortness of breath.   Cardiovascular:  Negative for chest pain, palpitations and leg swelling.  Gastrointestinal:  Negative for abdominal pain, diarrhea, nausea and vomiting.  Genitourinary:  Negative for decreased urine volume and difficulty urinating.  Neurological:  Negative for dizziness, weakness and headaches.  Psychiatric/Behavioral:  Positive for agitation, decreased concentration and sleep disturbance. Negative for behavioral problems, confusion, dysphoric mood, hallucinations, self-injury and suicidal ideas. The patient is nervous/anxious. The patient is not hyperactive.   All other systems reviewed and are negative.      Objective:  BP 110/76   Pulse 69   Temp 98.6 F (37 C)   Ht 5' 6"  (1.676 m)   Wt 246 lb (111.6 kg)   LMP 08/17/2021 (Exact Date)   SpO2 96%   BMI 39.71 kg/m    Wt Readings from Last 3 Encounters:  09/15/21 246 lb (111.6 kg)  08/17/21 249 lb (112.9 kg)  05/25/21 245 lb (111.1 kg)    Physical Exam Vitals and nursing note reviewed.  Constitutional:      General: She is not in acute distress.    Appearance: Normal appearance. She is obese. She is not ill-appearing, toxic-appearing or diaphoretic.  HENT:     Head: Normocephalic and atraumatic.  Eyes:     Pupils: Pupils are equal, round, and reactive to light.  Cardiovascular:     Rate and Rhythm: Normal rate and regular rhythm.     Heart sounds: Normal heart sounds.  Pulmonary:     Effort:  Pulmonary effort is normal.     Breath sounds: Normal breath sounds.  Skin:    General: Skin is warm and dry.  Neurological:     General: No focal deficit present.     Mental Status: She is alert and oriented to person, place, and time.  Psychiatric:        Mood and Affect:  Mood normal.        Behavior: Behavior normal.        Thought Content: Thought content normal.        Judgment: Judgment normal.     Results for orders placed or performed in visit on 05/25/21  Thyroid Panel With TSH  Result Value Ref Range   TSH 30.500 (H) 0.450 - 4.500 uIU/mL   T4, Total 7.6 4.5 - 12.0 ug/dL   T3 Uptake Ratio 29 24 - 39 %   Free Thyroxine Index 2.2 1.2 - 4.9  Basic Metabolic Panel  Result Value Ref Range   Glucose 75 70 - 99 mg/dL   BUN 15 6 - 20 mg/dL   Creatinine, Ser 0.80 0.57 - 1.00 mg/dL   eGFR 99 >59 mL/min/1.73   BUN/Creatinine Ratio 19 9 - 23   Sodium 138 134 - 144 mmol/L   Potassium 4.0 3.5 - 5.2 mmol/L   Chloride 101 96 - 106 mmol/L   CO2 25 20 - 29 mmol/L   Calcium 8.3 (L) 8.7 - 10.2 mg/dL  Vitamin B12  Result Value Ref Range   Vitamin B-12 550 232 - 1,245 pg/mL  CBC with Differential  Result Value Ref Range   WBC 6.5 3.4 - 10.8 x10E3/uL   RBC 4.86 3.77 - 5.28 x10E6/uL   Hemoglobin 14.5 11.1 - 15.9 g/dL   Hematocrit 43.4 34.0 - 46.6 %   MCV 89 79 - 97 fL   MCH 29.8 26.6 - 33.0 pg   MCHC 33.4 31.5 - 35.7 g/dL   RDW 12.4 11.7 - 15.4 %   Platelets 231 150 - 450 x10E3/uL   Neutrophils 60 Not Estab. %   Lymphs 33 Not Estab. %   Monocytes 5 Not Estab. %   Eos 1 Not Estab. %   Basos 1 Not Estab. %   Neutrophils Absolute 3.9 1.4 - 7.0 x10E3/uL   Lymphocytes Absolute 2.2 0.7 - 3.1 x10E3/uL   Monocytes Absolute 0.3 0.1 - 0.9 x10E3/uL   EOS (ABSOLUTE) 0.1 0.0 - 0.4 x10E3/uL   Basophils Absolute 0.0 0.0 - 0.2 x10E3/uL   Immature Granulocytes 0 Not Estab. %   Immature Grans (Abs) 0.0 0.0 - 0.1 x10E3/uL  Iron, TIBC and Ferritin Panel  Result Value Ref Range   Total Iron  Binding Capacity 251 250 - 450 ug/dL   UIBC 175 131 - 425 ug/dL   Iron 76 27 - 159 ug/dL   Iron Saturation 30 15 - 55 %   Ferritin 51 15 - 150 ng/mL       Pertinent labs & imaging results that were available during my care of the patient were reviewed by me and considered in my medical decision making.  Assessment & Plan:  Delailah was seen today for medical management of chronic issues.  Diagnoses and all orders for this visit:  Depression, major, single episode, mild (HCC) GAD (generalized anxiety disorder) Feeling better but not at baseline. Will increase dosing to 40 mg to see if beneficial. No SI or HI. Follow up in 3 months or sooner if warranted.  -     FLUoxetine (PROZAC) 40 MG capsule; Take 1 capsule (40 mg total) by mouth daily.     Continue all other maintenance medications.  Follow up plan: Return in about 3 months (around 12/16/2021), or if symptoms worsen or fail to improve, for GAD, depression, thyroid.   Continue healthy lifestyle choices, including diet (rich in fruits, vegetables, and lean proteins, and low in  salt and simple carbohydrates) and exercise (at least 30 minutes of moderate physical activity daily).  Educational handout given for depression  The above assessment and management plan was discussed with the patient. The patient verbalized understanding of and has agreed to the management plan. Patient is aware to call the clinic if they develop any new symptoms or if symptoms persist or worsen. Patient is aware when to return to the clinic for a follow-up visit. Patient educated on when it is appropriate to go to the emergency department.   Monia Pouch, FNP-C [facility] [phone]

## 2021-09-27 ENCOUNTER — Other Ambulatory Visit: Payer: Self-pay | Admitting: Family Medicine

## 2021-09-27 DIAGNOSIS — E0500 Thyrotoxicosis with diffuse goiter without thyrotoxic crisis or storm: Secondary | ICD-10-CM

## 2021-09-27 DIAGNOSIS — E890 Postprocedural hypothyroidism: Secondary | ICD-10-CM

## 2021-09-28 NOTE — Telephone Encounter (Signed)
Pt saw Endo on 06/20/21, looks like there may have been a change in dose Do not see this in Reconciled meds Please advise

## 2021-09-30 NOTE — Telephone Encounter (Signed)
Pt aware and her Endocrinologist has just sent this in for her

## 2021-12-16 ENCOUNTER — Ambulatory Visit: Payer: Medicaid Other | Admitting: Family Medicine

## 2021-12-20 ENCOUNTER — Encounter: Payer: Self-pay | Admitting: Family Medicine

## 2021-12-23 ENCOUNTER — Encounter: Payer: Self-pay | Admitting: Family Medicine

## 2021-12-23 ENCOUNTER — Ambulatory Visit (INDEPENDENT_AMBULATORY_CARE_PROVIDER_SITE_OTHER): Payer: BC Managed Care – PPO | Admitting: Family Medicine

## 2021-12-23 VITALS — BP 113/86 | HR 70 | Temp 98.6°F | Ht 66.0 in | Wt 241.0 lb

## 2021-12-23 DIAGNOSIS — F320 Major depressive disorder, single episode, mild: Secondary | ICD-10-CM | POA: Diagnosis not present

## 2021-12-23 DIAGNOSIS — F411 Generalized anxiety disorder: Secondary | ICD-10-CM

## 2021-12-23 MED ORDER — CALCITRIOL 0.5 MCG PO CAPS: 0.5000 ug | ORAL_CAPSULE | Freq: Two times a day (BID) | ORAL | 2 refills | Status: AC

## 2021-12-23 NOTE — Progress Notes (Signed)
Subjective:  Patient ID: Jennifer Rosales, female    DOB: [birth year], 36 y.o.   MRN: [id number]  Patient Care Team: Baruch Gouty, FNP as PCP - General (Family Medicine) Christophe Louis, MD (Obstetrics and Gynecology) Lavera Guise, [facility] as Pharmacist (Family Medicine)   Chief Complaint:  Medical Management of Chronic Issues   HPI: Jennifer Rosales is a 36 y.o. female presenting on 12/23/2021 for Medical Management of Chronic Issues   1. Depression, major, single episode, mild ([place]) 2. GAD (generalized anxiety disorder) [Pt presents today for anxiety and depression follow up. She has been on fluoxetine 40 mg daily. She is feeling better but is still symptomatic. Denies side effects from the medications, no SI or HI.     12/23/2021    3:45 PM 09/15/2021   10:25 AM 08/17/2021   10:31 AM 05/25/2021   10:14 AM  GAD 7 : Generalized Anxiety Score  Nervous, Anxious, on Edge 0 1 0 1  Control/stop worrying 0 1 2 0  Worry too much - different things 0 0 0 1  Trouble relaxing 1 1 3 1   Restless 1 1 2  0  Easily annoyed or irritable 0 2 3 2   Afraid - awful might happen 0 1 2 2   Total GAD 7 Score 2 7 12 7   Anxiety Difficulty Not difficult at all  Very difficult Not difficult at all       12/23/2021    3:44 PM 09/15/2021   10:24 AM 08/17/2021   10:31 AM 05/25/2021   10:13 AM 02/22/2021   10:22 AM  Depression screen PHQ 2/9  Decreased Interest 0 0 0 0 0  Down, Depressed, Hopeless 0 0 0 0 0  PHQ - 2 Score 0 0 0 0 0  Altered sleeping 1 1 2 1 2   Tired, decreased energy 1 2 2 1 2   Change in appetite 1 1 2 2 2   Feeling bad or failure about yourself  0 0 0 1 0  Trouble concentrating 0 1 3 0 0  Moving slowly or fidgety/restless 0 0 2 0 0  Suicidal thoughts 0 0 0 0 0  PHQ-9 Score 3 5 11 5 6   Difficult doing work/chores  Somewhat difficult Very difficult Somewhat difficult Not difficult at all     3. Hypocalcemia States she is about out of her Calcitrol and would like this refilled today.  She is due to see her endocrinologist this month to have labs.      Relevant past medical, surgical, family, and social history reviewed and updated as indicated.  Allergies and medications reviewed and updated. Data reviewed: Chart in Epic.   Past Medical History:  Diagnosis Date   Anemia    Anxiety    Depression, major, single episode, mild ([place]) 08/17/2021   Graves disease    Graves disease    Obesity     Past Surgical History:  Procedure Laterality Date   CHOLECYSTECTOMY     GALLBLADDER SURGERY  2008   LAPAROSCOPIC GASTRIC SLEEVE RESECTION     THYROIDECTOMY  06/01/2020   TONSILLECTOMY     TONSILLECTOMY AND ADENOIDECTOMY     WISDOM TOOTH EXTRACTION      Social History   Socioeconomic History   Marital status: Married    Spouse name: Regina Eck   Number of children: 3   Years of education: Not on file   Highest education level: Not on file  Occupational History   Not  on file  Tobacco Use   Smoking status: Never   Smokeless tobacco: Never  Vaping Use   Vaping Use: Never used  Substance and Sexual Activity   Alcohol use: Not Currently   Drug use: Not Currently   Sexual activity: Yes  Other Topics Concern   Not on file  Social History Narrative   ** Merged History Encounter **       Social Determinants of Health   Financial Resource Strain: Not on file  Food Insecurity: Not on file  Transportation Needs: Not on file  Physical Activity: Not on file  Stress: Not on file  Social Connections: Not on file  Intimate Partner Violence: Not on file    Outpatient Encounter Medications as of 12/23/2021  Medication Sig   FLUoxetine (PROZAC) 40 MG capsule Take 1 capsule (40 mg total) by mouth daily.   IFEREX 150 150 MG capsule Take 150 mg by mouth 2 (two) times daily.   levothyroxine (SYNTHROID) 200 MCG tablet Take 1 tablet (200 mcg total) by mouth daily.   levothyroxine (SYNTHROID) 50 MCG tablet Take 1 tablet (50 mcg total) by mouth daily.   [DISCONTINUED]  calcitRIOL (ROCALTROL) 0.5 MCG capsule Take 1 capsule by mouth in the morning and at bedtime.   calcitRIOL (ROCALTROL) 0.5 MCG capsule Take 1 capsule (0.5 mcg total) by mouth in the morning and at bedtime.   [DISCONTINUED] famotidine (PEPCID) 20 MG tablet Take 1 tablet (20 mg total) by mouth 2 (two) times daily.   No facility-administered encounter medications on file as of 12/23/2021.    No Known Allergies  Review of Systems  Constitutional:  Positive for fatigue. Negative for activity change, appetite change, chills, diaphoresis, fever and unexpected weight change.  HENT: Negative.    Eyes: Negative.  Negative for photophobia and visual disturbance.  Respiratory:  Negative for cough, chest tightness and shortness of breath.   Cardiovascular:  Negative for chest pain, palpitations and leg swelling.  Gastrointestinal:  Negative for abdominal pain, blood in stool, constipation, diarrhea, nausea and vomiting.  Endocrine: Negative.  Negative for cold intolerance, heat intolerance, polydipsia, polyphagia and polyuria.  Genitourinary:  Negative for decreased urine volume, difficulty urinating, dysuria, frequency and urgency.  Musculoskeletal:  Negative for arthralgias and myalgias.  Skin: Negative.   Allergic/Immunologic: Negative.   Neurological:  Negative for dizziness, tremors, seizures, syncope, facial asymmetry, speech difficulty, weakness, light-headedness, numbness and headaches.  Hematological: Negative.   Psychiatric/Behavioral:  Positive for sleep disturbance. Negative for agitation, behavioral problems, confusion, decreased concentration, dysphoric mood, hallucinations, self-injury and suicidal ideas. The patient is not nervous/anxious and is not hyperactive.   All other systems reviewed and are negative.       Objective:  BP 113/86   Pulse 70   Temp 98.6 F (37 C)   Ht 5' 6"  (1.676 m)   Wt 241 lb (109.3 kg)   LMP 12/15/2021   SpO2 96%   BMI 38.90 kg/m    Wt Readings  from Last 3 Encounters:  12/23/21 241 lb (109.3 kg)  09/15/21 246 lb (111.6 kg)  08/17/21 249 lb (112.9 kg)    Physical Exam Vitals and nursing note reviewed.  Constitutional:      General: She is not in acute distress.    Appearance: Normal appearance. She is well-developed and well-groomed. She is obese. She is not ill-appearing, toxic-appearing or diaphoretic.  HENT:     Head: Normocephalic and atraumatic.     Jaw: There is normal jaw occlusion.  Right Ear: Hearing normal.     Left Ear: Hearing normal.     Nose: Nose normal.     Mouth/Throat:     Lips: Pink.     Mouth: Mucous membranes are moist.     Pharynx: Uvula midline.  Eyes:     General: Lids are normal.     Pupils: Pupils are equal, round, and reactive to light.  Neck:     Thyroid: No thyroid mass, thyromegaly or thyroid tenderness.     Vascular: No JVD.     Trachea: Trachea and phonation normal.  Cardiovascular:     Rate and Rhythm: Normal rate and regular rhythm.     Chest Wall: PMI is not displaced.     Heart sounds: Normal heart sounds. No murmur heard.    No friction rub. No gallop.  Pulmonary:     Effort: Pulmonary effort is normal. No respiratory distress.     Breath sounds: Normal breath sounds. No wheezing.  Abdominal:     General: There is no abdominal bruit.     Palpations: There is no hepatomegaly or splenomegaly.  Musculoskeletal:     Cervical back: Neck supple.     Right lower leg: No edema.     Left lower leg: No edema.  Skin:    General: Skin is warm and dry.     Capillary Refill: Capillary refill takes less than 2 seconds.     Coloration: Skin is not cyanotic, jaundiced or pale.     Findings: No rash.  Neurological:     General: No focal deficit present.     Mental Status: She is alert and oriented to person, place, and time.     Sensory: Sensation is intact.     Motor: Motor function is intact.     Coordination: Coordination is intact.     Gait: Gait is intact.     Deep Tendon  Reflexes: Reflexes are normal and symmetric.  Psychiatric:        Attention and Perception: Attention and perception normal.        Mood and Affect: Mood and affect normal.        Speech: Speech normal.        Behavior: Behavior normal. Behavior is cooperative.        Thought Content: Thought content normal.        Cognition and Memory: Cognition and memory normal.        Judgment: Judgment normal.     Results for orders placed or performed in visit on 05/25/21  Thyroid Panel With TSH  Result Value Ref Range   TSH 30.500 (H) 0.450 - 4.500 uIU/mL   T4, Total 7.6 4.5 - 12.0 ug/dL   T3 Uptake Ratio 29 24 - 39 %   Free Thyroxine Index 2.2 1.2 - 4.9  Basic Metabolic Panel  Result Value Ref Range   Glucose 75 70 - 99 mg/dL   BUN 15 6 - 20 mg/dL   Creatinine, Ser 0.80 0.57 - 1.00 mg/dL   eGFR 99 >59 mL/min/1.73   BUN/Creatinine Ratio 19 9 - 23   Sodium 138 134 - 144 mmol/L   Potassium 4.0 3.5 - 5.2 mmol/L   Chloride 101 96 - 106 mmol/L   CO2 25 20 - 29 mmol/L   Calcium 8.3 (L) 8.7 - 10.2 mg/dL  Vitamin B12  Result Value Ref Range   Vitamin B-12 550 232 - 1,245 pg/mL  CBC with Differential  Result Value Ref Range  WBC 6.5 3.4 - 10.8 x10E3/uL   RBC 4.86 3.77 - 5.28 x10E6/uL   Hemoglobin 14.5 11.1 - 15.9 g/dL   Hematocrit 43.4 34.0 - 46.6 %   MCV 89 79 - 97 fL   MCH 29.8 26.6 - 33.0 pg   MCHC 33.4 31.5 - 35.7 g/dL   RDW 12.4 11.7 - 15.4 %   Platelets 231 150 - 450 x10E3/uL   Neutrophils 60 Not Estab. %   Lymphs 33 Not Estab. %   Monocytes 5 Not Estab. %   Eos 1 Not Estab. %   Basos 1 Not Estab. %   Neutrophils Absolute 3.9 1.4 - 7.0 x10E3/uL   Lymphocytes Absolute 2.2 0.7 - 3.1 x10E3/uL   Monocytes Absolute 0.3 0.1 - 0.9 x10E3/uL   EOS (ABSOLUTE) 0.1 0.0 - 0.4 x10E3/uL   Basophils Absolute 0.0 0.0 - 0.2 x10E3/uL   Immature Granulocytes 0 Not Estab. %   Immature Grans (Abs) 0.0 0.0 - 0.1 x10E3/uL  Iron, TIBC and Ferritin Panel  Result Value Ref Range   Total Iron  Binding Capacity 251 250 - 450 ug/dL   UIBC 175 131 - 425 ug/dL   Iron 76 27 - 159 ug/dL   Iron Saturation 30 15 - 55 %   Ferritin 51 15 - 150 ng/mL       Pertinent labs & imaging results that were available during my care of the patient were reviewed by me and considered in my medical decision making.  Assessment & Plan:  Muriel was seen today for medical management of chronic issues.  Diagnoses and all orders for this visit:  Depression, major, single episode, mild ([facility]) GAD (generalized anxiety disorder) Doing well but still symptomatic. Pt has fluoxetine 20 mg capsules at home form prior dosing. Will increase dosing to 60 mg daily to see if beneficial. Pt aware to let provider know if this works, if so, new prescription will be sent to pharmacy.   Hypocalcemia Has upcoming visit with endocrinology for labs. Will not complete today. Will obtain labs from upcoming visit. Renewed Calcitriol today.  -     calcitRIOL (ROCALTROL) 0.5 MCG capsule; Take 1 capsule (0.5 mcg total) by mouth in the morning and at bedtime.     Continue all other maintenance medications.  Follow up plan: Return in about 4 months (around 04/24/2022), or if symptoms worsen or fail to improve, for fasting labs.   Continue healthy lifestyle choices, including diet (rich in fruits, vegetables, and lean proteins, and low in salt and simple carbohydrates) and exercise (at least 30 minutes of moderate physical activity daily).  Educational handout given for hypothyroidism  The above assessment and management plan was discussed with the patient. The patient verbalized understanding of and has agreed to the management plan. Patient is aware to call the clinic if they develop any new symptoms or if symptoms persist or worsen. Patient is aware when to return to the clinic for a follow-up visit. Patient educated on when it is appropriate to go to the emergency department.   Monia Pouch, FNP-C [facility] [phone]

## 2022-02-08 DIAGNOSIS — Z6841 Body Mass Index (BMI) 40.0 and over, adult: Secondary | ICD-10-CM | POA: Diagnosis not present

## 2022-02-08 DIAGNOSIS — Z9884 Bariatric surgery status: Secondary | ICD-10-CM | POA: Diagnosis not present

## 2022-04-25 ENCOUNTER — Ambulatory Visit: Payer: BC Managed Care – PPO | Admitting: Family Medicine

## 2022-04-25 ENCOUNTER — Encounter: Payer: Self-pay | Admitting: Family Medicine

## 2022-04-25 VITALS — BP 112/80 | HR 68 | Temp 97.5°F | Ht 66.0 in | Wt 234.4 lb

## 2022-04-25 DIAGNOSIS — F411 Generalized anxiety disorder: Secondary | ICD-10-CM | POA: Diagnosis not present

## 2022-04-25 DIAGNOSIS — E890 Postprocedural hypothyroidism: Secondary | ICD-10-CM | POA: Diagnosis not present

## 2022-04-25 DIAGNOSIS — E0500 Thyrotoxicosis with diffuse goiter without thyrotoxic crisis or storm: Secondary | ICD-10-CM | POA: Diagnosis not present

## 2022-04-25 DIAGNOSIS — F320 Major depressive disorder, single episode, mild: Secondary | ICD-10-CM

## 2022-04-25 DIAGNOSIS — E559 Vitamin D deficiency, unspecified: Secondary | ICD-10-CM

## 2022-04-25 MED ORDER — LEVOTHYROXINE SODIUM 200 MCG PO TABS: 200.0000 ug | ORAL_TABLET | Freq: Every day | ORAL | 1 refills | Status: DC

## 2022-04-25 MED ORDER — LEVOTHYROXINE SODIUM 50 MCG PO TABS: 50.0000 ug | ORAL_TABLET | Freq: Every day | ORAL | 1 refills | Status: DC

## 2022-04-25 MED ORDER — FLUOXETINE HCL 20 MG PO CAPS: 20.0000 mg | ORAL_CAPSULE | Freq: Every day | ORAL | 3 refills | Status: DC

## 2022-04-25 MED ORDER — FLUOXETINE HCL 40 MG PO CAPS: 40.0000 mg | ORAL_CAPSULE | Freq: Every day | ORAL | 3 refills | Status: DC

## 2022-04-25 NOTE — Progress Notes (Signed)
Subjective:  Patient ID: Jennifer Rosales, female    DOB: 1985/09/09, 37 y.o.   MRN: [id number]  Patient Care Team: Sonny Masters, FNP as PCP - General (Family Medicine) Gerald Leitz, MD (Obstetrics and Gynecology) Danella Maiers, [facility] as Pharmacist (Family Medicine)   Chief Complaint:  Depression and Thyroid Problem (4 month follow up )   HPI: Jennifer Rosales is a 37 y.o. female presenting on 04/25/2022 for Depression and Thyroid Problem (4 month follow up )   1. Graves disease 2. S/P total thyroidectomy Denies hypo- or hyperthyroid symptoms. Has not followed up with endocrinology recently. Last noted TSH 05/2021. Has not changed repletion dosing.   3. GAD (generalized anxiety disorder) 4. Depression, major, single episode, mild (H[facility]) On fluoxetine 40 mg daily and feels this is beneficial but she is still symptomatic.     04/25/2022    3:56 PM 12/23/2021    3:44 PM 09/15/2021   10:24 AM 08/17/2021   10:31 AM 05/25/2021   10:13 AM  Depression screen PHQ 2/9  Decreased Interest 0 0 0 0 0  Down, Depressed, Hopeless 0 0 0 0 0  PHQ - 2 Score 0 0 0 0 0  Altered sleeping 0 1 1 2 1   Tired, decreased energy 0 1 2 2 1   Change in appetite 0 1 1 2 2   Feeling bad or failure about yourself  0 0 0 0 1  Trouble concentrating 0 0 1 3 0  Moving slowly or fidgety/restless 0 0 0 2 0  Suicidal thoughts 0 0 0 0 0  PHQ-9 Score 0 3 5 11 5   Difficult doing work/chores Not difficult at all  Somewhat difficult Very difficult Somewhat difficult      04/25/2022    3:57 PM 12/23/2021    3:45 PM 09/15/2021   10:25 AM 08/17/2021   10:31 AM  GAD 7 : Generalized Anxiety Score  Nervous, Anxious, on Edge 0 0 1 0  Control/stop worrying 0 0 1 2  Worry too much - different things 0 0 0 0  Trouble relaxing 0 1 1 3   Restless 0 1 1 2   Easily annoyed or irritable 0 0 2 3  Afraid - awful might happen 0 0 1 2  Total GAD 7 Score 0 2 7 12   Anxiety Difficulty Not difficult at all Not difficult at all  Very  difficult    5. Hypocalcemia 6. Vitamin D deficiency  Currently in Calcitrol and tolerating well. Denies muscle spasms, weakness, confusion, arthralgias, trouble walking, or recent fractures.    Relevant past medical, surgical, family, and social history reviewed and updated as indicated.  Allergies and medications reviewed and updated. Data reviewed: Chart in Epic.   Past Medical History:  Diagnosis Date   Anemia    Anxiety    Depression, major, single episode, mild ([facility]) 08/17/2021   Graves disease    Graves disease    Obesity     Past Surgical History:  Procedure Laterality Date   CHOLECYSTECTOMY     GALLBLADDER SURGERY  2008   LAPAROSCOPIC GASTRIC SLEEVE RESECTION     THYROIDECTOMY  06/01/2020   TONSILLECTOMY     TONSILLECTOMY AND ADENOIDECTOMY     WISDOM TOOTH EXTRACTION      Social History   Socioeconomic History   Marital status: Married    Spouse name: 08/19/2021   Number of children: 3   Years of education: Not on file  Highest education level: Not on file  Occupational History   Not on file  Tobacco Use   Smoking status: Never   Smokeless tobacco: Never  Vaping Use   Vaping Use: Never used  Substance and Sexual Activity   Alcohol use: Not Currently   Drug use: Not Currently   Sexual activity: Yes  Other Topics Concern   Not on file  Social History Narrative   ** Merged History Encounter **       Social Determinants of Health   Financial Resource Strain: Not on file  Food Insecurity: Not on file  Transportation Needs: Not on file  Physical Activity: Not on file  Stress: Not on file  Social Connections: Not on file  Intimate Partner Violence: Not on file    Outpatient Encounter Medications as of 04/25/2022  Medication Sig   FLUoxetine (PROZAC) 20 MG capsule Take 1 capsule (20 mg total) by mouth daily.   IFEREX 150 150 MG capsule Take 150 mg by mouth 2 (two) times daily.   [DISCONTINUED] FLUoxetine (PROZAC) 40 MG capsule Take 1 capsule  (40 mg total) by mouth daily.   [DISCONTINUED] levothyroxine (SYNTHROID) 200 MCG tablet Take 1 tablet (200 mcg total) by mouth daily.   [DISCONTINUED] levothyroxine (SYNTHROID) 50 MCG tablet Take 1 tablet (50 mcg total) by mouth daily.   FLUoxetine (PROZAC) 40 MG capsule Take 1 capsule (40 mg total) by mouth daily.   levothyroxine (SYNTHROID) 200 MCG tablet Take 1 tablet (200 mcg total) by mouth daily.   levothyroxine (SYNTHROID) 50 MCG tablet Take 1 tablet (50 mcg total) by mouth daily.   No facility-administered encounter medications on file as of 04/25/2022.    No Known Allergies  Review of Systems  Constitutional:  Positive for activity change and fatigue. Negative for appetite change, chills, diaphoresis, fever and unexpected weight change.  HENT: Negative.  Negative for congestion.   Eyes: Negative.   Respiratory:  Negative for apnea, cough, choking, chest tightness, shortness of breath, wheezing and stridor.   Cardiovascular:  Negative for chest pain, palpitations and leg swelling.  Gastrointestinal:  Negative for abdominal pain, blood in stool, constipation, diarrhea, nausea and vomiting.  Endocrine: Negative.  Negative for cold intolerance, heat intolerance, polydipsia, polyphagia and polyuria.  Genitourinary:  Negative for decreased urine volume, difficulty urinating, dysuria, frequency and urgency.  Musculoskeletal:  Negative for arthralgias and myalgias.  Skin: Negative.   Allergic/Immunologic: Negative.   Neurological:  Negative for dizziness and headaches.  Hematological: Negative.   Psychiatric/Behavioral:  Negative for agitation, behavioral problems, confusion, decreased concentration, dysphoric mood, hallucinations, self-injury, sleep disturbance and suicidal ideas. The patient is nervous/anxious. The patient is not hyperactive.   All other systems reviewed and are negative.       Objective:  BP 112/80   Pulse 68   Temp (!) 97.5 F (36.4 C) (Temporal)   Ht 5\' 6"   (1.676 m)   Wt 234 lb 6.4 oz (106.3 kg)   LMP 04/18/2022   SpO2 99%   BMI 37.83 kg/m    Wt Readings from Last 3 Encounters:  04/25/22 234 lb 6.4 oz (106.3 kg)  12/23/21 241 lb (109.3 kg)  09/15/21 246 lb (111.6 kg)    Physical Exam Vitals and nursing note reviewed.  Constitutional:      General: She is not in acute distress.    Appearance: Normal appearance. She is well-developed and well-groomed. She is obese. She is not ill-appearing, toxic-appearing or diaphoretic.  HENT:     Head:  Normocephalic and atraumatic.     Jaw: There is normal jaw occlusion.     Right Ear: Hearing normal.     Left Ear: Hearing normal.     Nose: Nose normal.     Mouth/Throat:     Lips: Pink.     Mouth: Mucous membranes are moist.     Pharynx: Oropharynx is clear. Uvula midline.  Eyes:     General: Lids are normal.     Conjunctiva/sclera: Conjunctivae normal.     Pupils: Pupils are equal, round, and reactive to light.  Neck:     Trachea: Trachea and phonation normal.  Cardiovascular:     Rate and Rhythm: Normal rate and regular rhythm.     Chest Wall: PMI is not displaced.     Pulses: Normal pulses.     Heart sounds: Normal heart sounds. No murmur heard.    No friction rub. No gallop.  Pulmonary:     Effort: Pulmonary effort is normal. No respiratory distress.     Breath sounds: Normal breath sounds. No wheezing.  Abdominal:     General: There is no abdominal bruit.     Palpations: There is no hepatomegaly or splenomegaly.  Musculoskeletal:     Cervical back: Normal range of motion and neck supple.  Skin:    General: Skin is warm and dry.     Capillary Refill: Capillary refill takes less than 2 seconds.     Coloration: Skin is not cyanotic, jaundiced or pale.     Findings: No rash.  Neurological:     General: No focal deficit present.     Mental Status: She is alert and oriented to person, place, and time.     Sensory: Sensation is intact.     Motor: Motor function is intact.      Coordination: Coordination is intact.     Gait: Gait is intact.     Deep Tendon Reflexes: Reflexes are normal and symmetric.  Psychiatric:        Attention and Perception: Attention and perception normal.        Mood and Affect: Mood and affect normal.        Speech: Speech normal.        Behavior: Behavior normal. Behavior is cooperative.        Thought Content: Thought content normal.        Cognition and Memory: Cognition and memory normal.        Judgment: Judgment normal.     Results for orders placed or performed in visit on 05/25/21  Thyroid Panel With TSH  Result Value Ref Range   TSH 30.500 (H) 0.450 - 4.500 uIU/mL   T4, Total 7.6 4.5 - 12.0 ug/dL   T3 Uptake Ratio 29 24 - 39 %   Free Thyroxine Index 2.2 1.2 - 4.9  Basic Metabolic Panel  Result Value Ref Range   Glucose 75 70 - 99 mg/dL   BUN 15 6 - 20 mg/dL   Creatinine, Ser 0.80 0.57 - 1.00 mg/dL   eGFR 99 >59 mL/min/1.73   BUN/Creatinine Ratio 19 9 - 23   Sodium 138 134 - 144 mmol/L   Potassium 4.0 3.5 - 5.2 mmol/L   Chloride 101 96 - 106 mmol/L   CO2 25 20 - 29 mmol/L   Calcium 8.3 (L) 8.7 - 10.2 mg/dL  Vitamin B12  Result Value Ref Range   Vitamin B-12 550 232 - 1,245 pg/mL  CBC with Differential  Result Value  Ref Range   WBC 6.5 3.4 - 10.8 x10E3/uL   RBC 4.86 3.77 - 5.28 x10E6/uL   Hemoglobin 14.5 11.1 - 15.9 g/dL   Hematocrit 24.2 35.3 - 46.6 %   MCV 89 79 - 97 fL   MCH 29.8 26.6 - 33.0 pg   MCHC 33.4 31.5 - 35.7 g/dL   RDW 61.4 43.1 - 54.0 %   Platelets 231 150 - 450 x10E3/uL   Neutrophils 60 Not Estab. %   Lymphs 33 Not Estab. %   Monocytes 5 Not Estab. %   Eos 1 Not Estab. %   Basos 1 Not Estab. %   Neutrophils Absolute 3.9 1.4 - 7.0 x10E3/uL   Lymphocytes Absolute 2.2 0.7 - 3.1 x10E3/uL   Monocytes Absolute 0.3 0.1 - 0.9 x10E3/uL   EOS (ABSOLUTE) 0.1 0.0 - 0.4 x10E3/uL   Basophils Absolute 0.0 0.0 - 0.2 x10E3/uL   Immature Granulocytes 0 Not Estab. %   Immature Grans (Abs) 0.0 0.0 - 0.1  x10E3/uL  Iron, TIBC and Ferritin Panel  Result Value Ref Range   Total Iron Binding Capacity 251 250 - 450 ug/dL   UIBC 086 761 - 950 ug/dL   Iron 76 27 - 932 ug/dL   Iron Saturation 30 15 - 55 %   Ferritin 51 15 - 150 ng/mL       Pertinent labs & imaging results that were available during my care of the patient were reviewed by me and considered in my medical decision making.  Assessment & Plan:  Tyrone was seen today for depression and thyroid problem.  Diagnoses and all orders for this visit:  Graves disease S/P total thyroidectomy Labs pending. Will adjust regimen if warranted. Has not scheduled a follow up with endocrinology.  -     levothyroxine (SYNTHROID) 200 MCG tablet; Take 1 tablet (200 mcg total) by mouth daily. -     levothyroxine (SYNTHROID) 50 MCG tablet; Take 1 tablet (50 mcg total) by mouth daily. -     CBC with Differential/Platelet -     TSH -     T4, Free -     T3, Free  GAD (generalized anxiety disorder) Depression, major, single episode, mild (HCC) Fairly controlled but still symptomatic. Will increase dosing to 60 mg daily. Pt aware to report new, worsening, or persistent symptoms.  -     FLUoxetine (PROZAC) 20 MG capsule; Take 1 capsule (20 mg total) by mouth daily. -     FLUoxetine (PROZAC) 40 MG capsule; Take 1 capsule (40 mg total) by mouth daily. -     CBC with Differential/Platelet  Hypocalcemia Vitamin D deficiency On Calcitrol. Will check labs today.  -     VITAMIN D 25 Hydroxy (Vit-D Deficiency, Fractures) -     CBC with Differential/Platelet -     CMP14+EGFR  Morbid (severe) obesity due to excess calories (HCC) Diet and exercise encouraged. Labs pending.  -     CBC with Differential/Platelet -     CMP14+EGFR -     TSH -     T4, Free -     T3, Free -     VITAMIN D 25 Hydroxy (Vit-D Deficiency, Fractures)     Continue all other maintenance medications.  Follow up plan: Return in about 3 months (around 07/25/2022) for GAD,  thyroid.   Continue healthy lifestyle choices, including diet (rich in fruits, vegetables, and lean proteins, and low in salt and simple carbohydrates) and exercise (at least 30 minutes  of moderate physical activity daily).  Educational handout given for hypothyroidism   The above assessment and management plan was discussed with the patient. The patient verbalized understanding of and has agreed to the management plan. Patient is aware to call the clinic if they develop any new symptoms or if symptoms persist or worsen. Patient is aware when to return to the clinic for a follow-up visit. Patient educated on when it is appropriate to go to the emergency department.   Monia Pouch, FNP-C [facility] [phone]

## 2022-04-26 LAB — CMP14+EGFR
ALT: 10 IU/L (ref 0–32)
AST: 13 IU/L (ref 0–40)
Albumin/Globulin Ratio: 1.4 (ref 1.2–2.2)
Albumin: 3.9 g/dL (ref 3.9–4.9)
Alkaline Phosphatase: 114 IU/L (ref 44–121)
BUN/Creatinine Ratio: 22 (ref 9–23)
BUN: 15 mg/dL (ref 6–20)
Bilirubin Total: <0.2 mg/dL (ref 0.0–1.2)
CO2: 23 mmol/L (ref 20–29)
Calcium: 7.8 mg/dL — ABNORMAL LOW (ref 8.7–10.2)
Chloride: 105 mmol/L (ref 96–106)
Creatinine, Ser: 0.68 mg/dL (ref 0.57–1.00)
Globulin, Total: 2.7 g/dL (ref 1.5–4.5)
Glucose: 79 mg/dL (ref 70–99)
Potassium: 3.8 mmol/L (ref 3.5–5.2)
Sodium: 142 mmol/L (ref 134–144)
Total Protein: 6.6 g/dL (ref 6.0–8.5)
eGFR: 117 mL/min/1.73

## 2022-04-26 LAB — CBC WITH DIFFERENTIAL/PLATELET
Basophils Absolute: 0 x10E3/uL (ref 0.0–0.2)
Basos: 0 %
EOS (ABSOLUTE): 0.1 x10E3/uL (ref 0.0–0.4)
Eos: 2 %
Hematocrit: 40.2 % (ref 34.0–46.6)
Hemoglobin: 13.2 g/dL (ref 11.1–15.9)
Immature Grans (Abs): 0 x10E3/uL (ref 0.0–0.1)
Immature Granulocytes: 0 %
Lymphocytes Absolute: 2.4 x10E3/uL (ref 0.7–3.1)
Lymphs: 33 %
MCH: 26.2 pg — ABNORMAL LOW (ref 26.6–33.0)
MCHC: 32.8 g/dL (ref 31.5–35.7)
MCV: 80 fL (ref 79–97)
Monocytes Absolute: 0.5 x10E3/uL (ref 0.1–0.9)
Monocytes: 7 %
Neutrophils Absolute: 4.3 x10E3/uL (ref 1.4–7.0)
Neutrophils: 58 %
Platelets: 272 x10E3/uL (ref 150–450)
RBC: 5.03 x10E6/uL (ref 3.77–5.28)
RDW: 14.5 % (ref 11.7–15.4)
WBC: 7.3 x10E3/uL (ref 3.4–10.8)

## 2022-04-26 LAB — T3, FREE: T3, Free: 3.6 pg/mL (ref 2.0–4.4)

## 2022-04-26 LAB — TSH: TSH: <0.005 u[IU]/mL — ABNORMAL LOW (ref 0.450–4.500)

## 2022-04-26 LAB — VITAMIN D 25 HYDROXY (VIT D DEFICIENCY, FRACTURES): Vit D, 25-Hydroxy: 18.2 ng/mL — ABNORMAL LOW (ref 30.0–100.0)

## 2022-04-26 LAB — T4, FREE: Free T4: 2.24 ng/dL — ABNORMAL HIGH (ref 0.82–1.77)

## 2022-05-12 ENCOUNTER — Encounter: Payer: Self-pay | Admitting: Family Medicine

## 2022-07-27 ENCOUNTER — Encounter: Payer: Self-pay | Admitting: Family Medicine

## 2022-07-27 ENCOUNTER — Ambulatory Visit: Payer: BC Managed Care – PPO | Admitting: Family Medicine

## 2022-07-27 VITALS — BP 107/76 | HR 66 | Temp 97.3°F | Ht 66.0 in | Wt 245.0 lb

## 2022-07-27 DIAGNOSIS — E0500 Thyrotoxicosis with diffuse goiter without thyrotoxic crisis or storm: Secondary | ICD-10-CM | POA: Diagnosis not present

## 2022-07-27 DIAGNOSIS — E559 Vitamin D deficiency, unspecified: Secondary | ICD-10-CM

## 2022-07-27 DIAGNOSIS — E890 Postprocedural hypothyroidism: Secondary | ICD-10-CM

## 2022-07-27 DIAGNOSIS — F320 Major depressive disorder, single episode, mild: Secondary | ICD-10-CM

## 2022-07-27 DIAGNOSIS — F411 Generalized anxiety disorder: Secondary | ICD-10-CM | POA: Diagnosis not present

## 2022-07-27 MED ORDER — FLUOXETINE HCL 20 MG PO CAPS: 20.0000 mg | ORAL_CAPSULE | Freq: Every day | ORAL | 3 refills | Status: DC

## 2022-07-27 NOTE — Patient Instructions (Signed)
Here is a guide to help us find out which weight loss medications will be covered by your insurance plan.  Please check out this web site  [employer] and follow the instructions.   There is also a phone number you can call if you do not have access to the Internet. Call [phone] (Monday- Friday 8am-8pm) and an associate will help navigate you.   [employer] provides coverage information for more than 80% of the inquiries submitted!!  

## 2022-07-27 NOTE — Progress Notes (Signed)
Subjective:  Patient ID: Jennifer Rosales, female    DOB: Nov 26, 1985, 37 y.o.   MRN: [id number]  Patient Care Team: Sonny Masters, FNP as PCP - General (Family Medicine) Gerald Leitz, MD (Obstetrics and Gynecology) Danella Maiers, [facility] as Pharmacist (Family Medicine)   Chief Complaint:  Anxiety and thyroid check  (3 month follow up )   HPI: Jennifer Rosales is a 37 y.o. female presenting on 07/27/2022 for Anxiety and thyroid check  (3 month follow up )    1. Graves disease 2. S/P total thyroidectomy Has not seen Dr. Shawnee Knapp for over a year. Does report fatigue, increased depressive symptoms, and weight gain. Has also been off of her Qsymia for several weeks. Unsure if due to lack of medication or inadequate thyroid repletion.   3. GAD (generalized anxiety disorder) 4. Depression, major, single episode, mild (HCC) Doing ok on current medications but does feel her symptoms have increased over the last few weeks. She states she has been feeling more anxious and depressed. She is taking fluoxetine 60 mg daily as prescribed.     07/27/2022   11:36 AM 04/25/2022    3:57 PM 12/23/2021    3:45 PM 09/15/2021   10:25 AM  GAD 7 : Generalized Anxiety Score  Nervous, Anxious, on Edge 1 0 0 1  Control/stop worrying 1 0 0 1  Worry too much - different things 1 0 0 0  Trouble relaxing 1 0 1 1  Restless 1 0 1 1  Easily annoyed or irritable 2 0 0 2  Afraid - awful might happen 2 0 0 1  Total GAD 7 Score 9 0 2 7  Anxiety Difficulty Somewhat difficult Not difficult at all Not difficult at all        07/27/2022   11:35 AM 04/25/2022    3:56 PM 12/23/2021    3:44 PM 09/15/2021   10:24 AM 08/17/2021   10:31 AM  Depression screen PHQ 2/9  Decreased Interest 0 0 0 0 0  Down, Depressed, Hopeless 0 0 0 0 0  PHQ - 2 Score 0 0 0 0 0  Altered sleeping 1 0 Tired, decreased energy 1 0 Change in appetite 1 0 Feeling bad or failure about yourself  1 0 0 0 0  Trouble concentrating 0  0 0 1 3  Moving slowly or fidgety/restless 0 0 0 0 2  Suicidal thoughts 0 0 0 0 0  PHQ-9 Score 4 0 Difficult doing work/chores Not difficult at all Not difficult at all  Somewhat difficult Very difficult     5. Hypocalcemia No tetany, spasms, weakness, confusion, or pain. Not on calcium supplementation.   6. Vitamin D deficiency Currently not on repletion therapy. Denies arthralgias, trouble walking, or recent fractures.   7. Morbid (severe) obesity due to excess calories (HCC) Has been treated by Dr. Adolphus Birchwood with a gastric sleeve and the weight management clinic. Was on Qsymia but has been out due to lapse in follow up. Does not follow a strict diet or exercise routine.      Relevant past medical, surgical, family, and social history reviewed and updated as indicated.  Allergies and medications reviewed and updated. Data reviewed: Chart in Epic.   Past Medical History:  Diagnosis Date   Anemia    Anxiety    Depression, major, single episode, mild (HCC) 08/17/2021  Graves disease    Graves disease    Obesity     Past Surgical History:  Procedure Laterality Date   CHOLECYSTECTOMY     GALLBLADDER SURGERY  2008   LAPAROSCOPIC GASTRIC SLEEVE RESECTION     THYROIDECTOMY  06/01/2020   TONSILLECTOMY     TONSILLECTOMY AND ADENOIDECTOMY     WISDOM TOOTH EXTRACTION      Social History   Socioeconomic History   Marital status: Married    Spouse name: Allayne Butcher   Number of children: 3   Years of education: Not on file   Highest education level: Associate degree: academic program  Occupational History   Not on file  Tobacco Use   Smoking status: Never   Smokeless tobacco: Never  Vaping Use   Vaping Use: Never used  Substance and Sexual Activity   Alcohol use: Not Currently   Drug use: Not Currently   Sexual activity: Yes  Other Topics Concern   Not on file  Social History Narrative   ** Merged History Encounter **       Social Determinants of Health    Financial Resource Strain: Low Risk  (07/23/2022)   Overall Financial Resource Strain (CARDIA)    Difficulty of Paying Living Expenses: Not hard at all  Food Insecurity: No Food Insecurity (07/23/2022)   Hunger Vital Sign    Worried About Running Out of Food in the Last Year: Never true    Ran Out of Food in the Last Year: Never true  Transportation Needs: No Transportation Needs (07/23/2022)   PRAPARE - Administrator, Civil Service (Medical): No    Lack of Transportation (Non-Medical): No  Physical Activity: Insufficiently Active (07/23/2022)   Exercise Vital Sign    Days of Exercise per Week: 3 days    Minutes of Exercise per Session: 30 min  Stress: Stress Concern Present (07/23/2022)   [employer] of Occupational Health - Occupational Stress Questionnaire    Feeling of Stress : To some extent  Social Connections: Unknown (07/23/2022)   Social Connection and Isolation Panel [NHANES]    Frequency of Communication with Friends and Family: More than three times a week    Frequency of Social Gatherings with Friends and Family: Twice a week    Attends Religious Services: Patient declined    Database administrator or Organizations: Yes    Attends Engineer, structural: More than 4 times per year    Marital Status: Married  Catering manager Violence: Not on file    Outpatient Encounter Medications as of 07/27/2022  Medication Sig   FLUoxetine (PROZAC) 40 MG capsule Take 1 capsule (40 mg total) by mouth daily.   IFEREX 150 150 MG capsule Take 150 mg by mouth 2 (two) times daily.   levothyroxine (SYNTHROID) 200 MCG tablet Take 1 tablet (200 mcg total) by mouth daily.   levothyroxine (SYNTHROID) 50 MCG tablet Take 1 tablet (50 mcg total) by mouth daily.   FLUoxetine (PROZAC) 20 MG capsule Take 1 capsule (20 mg total) by mouth daily.   [DISCONTINUED] FLUoxetine (PROZAC) 20 MG capsule Take 1 capsule (20 mg total) by mouth daily.   No facility-administered encounter  medications on file as of 07/27/2022.    No Known Allergies  Review of Systems  Constitutional:  Positive for activity change, appetite change, fatigue and unexpected weight change. Negative for chills, diaphoresis and fever.  HENT: Negative.    Eyes: Negative.  Negative for photophobia and visual  disturbance.  Respiratory:  Negative for cough, chest tightness and shortness of breath.   Cardiovascular:  Negative for chest pain, palpitations and leg swelling.  Gastrointestinal:  Negative for abdominal pain, blood in stool, constipation, diarrhea, nausea and vomiting.  Endocrine: Negative.  Negative for cold intolerance, heat intolerance, polydipsia, polyphagia and polyuria.  Genitourinary:  Negative for decreased urine volume, difficulty urinating, dysuria, frequency and urgency.  Musculoskeletal:  Negative for arthralgias and myalgias.  Skin: Negative.   Allergic/Immunologic: Negative.   Neurological:  Negative for dizziness, tremors, seizures, syncope, facial asymmetry, speech difficulty, weakness, light-headedness, numbness and headaches.  Hematological: Negative.   Psychiatric/Behavioral:  Positive for agitation, decreased concentration and sleep disturbance. Negative for behavioral problems, confusion, dysphoric mood, hallucinations, self-injury and suicidal ideas. The patient is nervous/anxious. The patient is not hyperactive.   All other systems reviewed and are negative.       Objective:  BP 107/76   Pulse 66   Temp (!) 97.3 F (36.3 C) (Temporal)   Ht 5\' 6"  (1.676 m)   Wt 245 lb (111.1 kg)   SpO2 97%   BMI 39.54 kg/m    Wt Readings from Last 3 Encounters:  07/27/22 245 lb (111.1 kg)  04/25/22 234 lb 6.4 oz (106.3 kg)  12/23/21 241 lb (109.3 kg)    Physical Exam Vitals and nursing note reviewed.  Constitutional:      General: She is not in acute distress.    Appearance: Normal appearance. She is well-developed and well-groomed. She is morbidly obese. She is not  ill-appearing, toxic-appearing or diaphoretic.  HENT:     Head: Normocephalic and atraumatic.     Jaw: There is normal jaw occlusion.     Right Ear: Hearing normal.     Left Ear: Hearing normal.     Nose: Nose normal.     Mouth/Throat:     Lips: Pink.     Mouth: Mucous membranes are moist.     Pharynx: Oropharynx is clear. Uvula midline.  Eyes:     General: Lids are normal.     Extraocular Movements: Extraocular movements intact.     Conjunctiva/sclera: Conjunctivae normal.     Pupils: Pupils are equal, round, and reactive to light.  Neck:     Thyroid: No thyroid mass, thyromegaly or thyroid tenderness.     Vascular: No carotid bruit or JVD.     Trachea: Trachea and phonation normal.  Cardiovascular:     Rate and Rhythm: Normal rate and regular rhythm.     Chest Wall: PMI is not displaced.     Pulses: Normal pulses.     Heart sounds: Normal heart sounds. No murmur heard.    No friction rub. No gallop.  Pulmonary:     Effort: Pulmonary effort is normal. No respiratory distress.     Breath sounds: Normal breath sounds. No wheezing.  Abdominal:     General: Bowel sounds are normal. There is no distension or abdominal bruit.     Palpations: Abdomen is soft. There is no hepatomegaly or splenomegaly.     Tenderness: There is no abdominal tenderness. There is no right CVA tenderness or left CVA tenderness.     Hernia: No hernia is present.  Musculoskeletal:        General: Normal range of motion.     Cervical back: Normal range of motion and neck supple.     Right lower leg: No edema.     Left lower leg: No edema.  Lymphadenopathy:  Cervical: No cervical adenopathy.  Skin:    General: Skin is warm and dry.     Capillary Refill: Capillary refill takes less than 2 seconds.     Coloration: Skin is not cyanotic, jaundiced or pale.     Findings: No rash.  Neurological:     General: No focal deficit present.     Mental Status: She is alert and oriented to person, place, and  time.     Sensory: Sensation is intact.     Motor: Motor function is intact.     Coordination: Coordination is intact.     Gait: Gait is intact.     Deep Tendon Reflexes: Reflexes are normal and symmetric.  Psychiatric:        Attention and Perception: Attention and perception normal.        Mood and Affect: Mood and affect normal.        Speech: Speech normal.        Behavior: Behavior normal. Behavior is cooperative.        Thought Content: Thought content normal.        Cognition and Memory: Cognition and memory normal.        Judgment: Judgment normal.     Results for orders placed or performed in visit on 04/25/22  CBC with Differential/Platelet  Result Value Ref Range   WBC 7.3 3.4 - 10.8 x10E3/uL   RBC 5.03 3.77 - 5.28 x10E6/uL   Hemoglobin 13.2 11.1 - 15.9 g/dL   Hematocrit 47.8 29.5 - 46.6 %   MCV 80 79 - 97 fL   MCH 26.2 (L) 26.6 - 33.0 pg   MCHC 32.8 31.5 - 35.7 g/dL   RDW 62.1 30.8 - 65.7 %   Platelets 272 150 - 450 x10E3/uL   Neutrophils 58 Not Estab. %   Lymphs 33 Not Estab. %   Monocytes 7 Not Estab. %   Eos 2 Not Estab. %   Basos 0 Not Estab. %   Neutrophils Absolute 4.3 1.4 - 7.0 x10E3/uL   Lymphocytes Absolute 2.4 0.7 - 3.1 x10E3/uL   Monocytes Absolute 0.5 0.1 - 0.9 x10E3/uL   EOS (ABSOLUTE) 0.1 0.0 - 0.4 x10E3/uL   Basophils Absolute 0.0 0.0 - 0.2 x10E3/uL   Immature Granulocytes 0 Not Estab. %   Immature Grans (Abs) 0.0 0.0 - 0.1 x10E3/uL  CMP14+EGFR  Result Value Ref Range   Glucose 79 70 - 99 mg/dL   BUN 15 6 - 20 mg/dL   Creatinine, Ser 8.46 0.57 - 1.00 mg/dL   eGFR 962 >95 MW/UXL/2.44   BUN/Creatinine Ratio 22 9 - 23   Sodium 142 134 - 144 mmol/L   Potassium 3.8 3.5 - 5.2 mmol/L   Chloride 105 96 - 106 mmol/L   CO2 23 20 - 29 mmol/L   Calcium 7.8 (L) 8.7 - 10.2 mg/dL   Total Protein 6.6 6.0 - 8.5 g/dL   Albumin 3.9 3.9 - 4.9 g/dL   Globulin, Total 2.7 1.5 - 4.5 g/dL   Albumin/Globulin Ratio 1.4 1.2 - 2.2   Bilirubin Total <0.2 0.0 -  1.2 mg/dL   Alkaline Phosphatase 114 44 - 121 IU/L   AST 13 0 - 40 IU/L   ALT 10 0 - 32 IU/L  TSH  Result Value Ref Range   TSH <0.005 (L) 0.450 - 4.500 uIU/mL  T4, Free  Result Value Ref Range   Free T4 2.24 (H) 0.82 - 1.77 ng/dL  T3, Free  Result Value Ref  Range   T3, Free 3.6 2.0 - 4.4 pg/mL  VITAMIN D 25 Hydroxy (Vit-D Deficiency, Fractures)  Result Value Ref Range   Vit D, 25-Hydroxy 18.2 (L) 30.0 - 100.0 ng/mL       Pertinent labs & imaging results that were available during my care of the patient were reviewed by me and considered in my medical decision making.  Assessment & Plan:  [place] was seen today for anxiety and thyroid check .  Diagnoses and all orders for this visit:  Graves disease S/P total thyroidectomy Lst saw Dr. Shawnee Knapp 06/2021. Will check labs today and adjust medications if warranted.  -     Thyroid Panel With TSH  GAD (generalized anxiety disorder) Depression, major, single episode, mild (HCC) Feels she has been doing fairly well on her anxiety and depression medications. Is struggling with her weight and feels this has increased her depressive symptoms. She is gong to weight management.  -     CMP14+EGFR -     CBC with Differential/Platelet -     VITAMIN D 25 Hydroxy (Vit-D Deficiency, Fractures) -     Thyroid Panel With TSH -     FLUoxetine (PROZAC) 20 MG capsule; Take 1 capsule (20 mg total) by mouth daily.  Hypocalcemia Will repeat labs today and adjust medications if warranted.  -     CMP14+EGFR -     VITAMIN D 25 Hydroxy (Vit-D Deficiency, Fractures)  Vitamin D deficiency Labs pending. Continue repletion therapy. If indicated, will change repletion dosage. Eat foods rich in Vit D including milk, orange juice, yogurt with vitamin D added, salmon or mackerel, canned tuna fish, cereals with vitamin D added, and cod liver oil. Get out in the sun but make sure to wear at least SPF 30 sunscreen.  -     CMP14+EGFR -     VITAMIN D 25 Hydroxy (Vit-D  Deficiency, Fractures)  Morbid (severe) obesity due to excess calories (HCC) Has been treated by Dr. Adolphus Birchwood with bariatrics, was going to weight management but is unable to follow up until July. Was on Qsymia, but has been out for over a month. Will check labs today. Pt aware to follow up with weight management as soon as possible.  -     CMP14+EGFR -     CBC with Differential/Platelet -     VITAMIN D 25 Hydroxy (Vit-D Deficiency, Fractures) -     Thyroid Panel With TSH     Continue all other maintenance medications.  Follow up plan: Return in about 3 months (around 10/26/2022), or if symptoms worsen or fail to improve, for thyroid.   Continue healthy lifestyle choices, including diet (rich in fruits, vegetables, and lean proteins, and low in salt and simple carbohydrates) and exercise (at least 30 minutes of moderate physical activity daily).   The above assessment and management plan was discussed with the patient. The patient verbalized understanding of and has agreed to the management plan. Patient is aware to call the clinic if they develop any new symptoms or if symptoms persist or worsen. Patient is aware when to return to the clinic for a follow-up visit. Patient educated on when it is appropriate to go to the emergency department.   Kari Baars, FNP-C [facility] [phone]

## 2022-07-28 LAB — CMP14+EGFR
ALT: 14 IU/L (ref 0–32)
AST: 16 IU/L (ref 0–40)
Albumin/Globulin Ratio: 1.8 (ref 1.2–2.2)
Albumin: 3.9 g/dL (ref 3.9–4.9)
Alkaline Phosphatase: 98 IU/L (ref 44–121)
BUN/Creatinine Ratio: 15 (ref 9–23)
BUN: 12 mg/dL (ref 6–20)
Bilirubin Total: <0.2 mg/dL (ref 0.0–1.2)
CO2: 24 mmol/L (ref 20–29)
Calcium: 7.3 mg/dL — ABNORMAL LOW (ref 8.7–10.2)
Chloride: 104 mmol/L (ref 96–106)
Creatinine, Ser: 0.8 mg/dL (ref 0.57–1.00)
Globulin, Total: 2.2 g/dL (ref 1.5–4.5)
Glucose: 73 mg/dL (ref 70–99)
Potassium: 3.9 mmol/L (ref 3.5–5.2)
Sodium: 140 mmol/L (ref 134–144)
Total Protein: 6.1 g/dL (ref 6.0–8.5)
eGFR: 98 mL/min/{1.73_m2} (ref >59)

## 2022-07-28 LAB — CBC WITH DIFFERENTIAL/PLATELET
Basophils Absolute: 0 10*3/uL (ref 0.0–0.2)
Basos: 0 %
EOS (ABSOLUTE): 0.1 10*3/uL (ref 0.0–0.4)
Eos: 1 %
Hematocrit: 40 % (ref 34.0–46.6)
Hemoglobin: 12.7 g/dL (ref 11.1–15.9)
Immature Grans (Abs): 0 10*3/uL (ref 0.0–0.1)
Immature Granulocytes: 0 %
Lymphocytes Absolute: 2 10*3/uL (ref 0.7–3.1)
Lymphs: 32 %
MCH: 26.5 pg — ABNORMAL LOW (ref 26.6–33.0)
MCHC: 31.8 g/dL (ref 31.5–35.7)
MCV: 84 fL (ref 79–97)
Monocytes Absolute: 0.5 10*3/uL (ref 0.1–0.9)
Monocytes: 7 %
Neutrophils Absolute: 3.7 10*3/uL (ref 1.4–7.0)
Neutrophils: 60 %
Platelets: 245 10*3/uL (ref 150–450)
RBC: 4.79 x10E6/uL (ref 3.77–5.28)
RDW: 13.6 % (ref 11.7–15.4)
WBC: 6.3 10*3/uL (ref 3.4–10.8)

## 2022-07-28 LAB — THYROID PANEL WITH TSH
Free Thyroxine Index: 2.2 (ref 1.2–4.9)
T3 Uptake Ratio: 30 % (ref 24–39)
T4, Total: 7.2 ug/dL (ref 4.5–12.0)
TSH: 1.53 u[IU]/mL (ref 0.450–4.500)

## 2022-07-28 LAB — VITAMIN D 25 HYDROXY (VIT D DEFICIENCY, FRACTURES): Vit D, 25-Hydroxy: 18.2 ng/mL — ABNORMAL LOW (ref 30.0–100.0)

## 2022-08-06 ENCOUNTER — Encounter: Payer: Self-pay | Admitting: Family Medicine

## 2022-09-20 ENCOUNTER — Ambulatory Visit: Payer: BC Managed Care – PPO | Admitting: Family Medicine

## 2022-09-20 ENCOUNTER — Encounter: Payer: Self-pay | Admitting: Family Medicine

## 2022-09-20 VITALS — Temp 97.7°F | Ht 66.0 in | Wt 247.4 lb

## 2022-09-20 DIAGNOSIS — R5383 Other fatigue: Secondary | ICD-10-CM | POA: Diagnosis not present

## 2022-09-20 DIAGNOSIS — R42 Dizziness and giddiness: Secondary | ICD-10-CM

## 2022-09-20 DIAGNOSIS — R5381 Other malaise: Secondary | ICD-10-CM

## 2022-09-20 NOTE — Progress Notes (Signed)
Subjective:  Patient ID: Jennifer Rosales, female    DOB: Aug 09, 1985, 37 y.o.   MRN: [id number]  Patient Care Team: Sonny Masters, FNP as PCP - General (Family Medicine) Gerald Leitz, MD (Obstetrics and Gynecology) Danella Maiers, [facility] as Pharmacist (Family Medicine)   Chief Complaint:  Fatigue (Tired, feels lightheaded, dizziness about 3-4 days, increased water intake, current iron supplements not helping)   HPI: Jennifer Rosales is a 37 y.o. female presenting on 09/20/2022 for Fatigue (Tired, feels lightheaded, dizziness about 3-4 days, increased water intake, current iron supplements not helping)   Pt presents today to have her iron levels checked. She has a history of IDA and reports over the last 3-4 days she has had increased fatigue with some dizziness and lightheadedness. She denies abnormal bleeding or bruising. No chest pain, palpitations, weakness, confusion, leg swelling, or syncope.      Relevant past medical, surgical, family, and social history reviewed and updated as indicated.  Allergies and medications reviewed and updated. Data reviewed: Chart in Epic.   Past Medical History:  Diagnosis Date   Anemia    Anxiety    Depression, major, single episode, mild (HCC) 08/17/2021   Graves disease    Graves disease    Obesity     Past Surgical History:  Procedure Laterality Date   CHOLECYSTECTOMY     GALLBLADDER SURGERY  2008   LAPAROSCOPIC GASTRIC SLEEVE RESECTION     THYROIDECTOMY  06/01/2020   TONSILLECTOMY     TONSILLECTOMY AND ADENOIDECTOMY     WISDOM TOOTH EXTRACTION      Social History   Socioeconomic History   Marital status: Married    Spouse name: Allayne Butcher   Number of children: 3   Years of education: Not on file   Highest education level: Associate degree: academic program  Occupational History   Not on file  Tobacco Use   Smoking status: Never   Smokeless tobacco: Never  Vaping Use   Vaping Use: Never used  Substance and Sexual  Activity   Alcohol use: Not Currently   Drug use: Not Currently   Sexual activity: Yes  Other Topics Concern   Not on file  Social History Narrative   ** Merged History Encounter **       Social Determinants of Health   Financial Resource Strain: Low Risk  (07/23/2022)   Overall Financial Resource Strain (CARDIA)    Difficulty of Paying Living Expenses: Not hard at all  Food Insecurity: No Food Insecurity (07/23/2022)   Hunger Vital Sign    Worried About Running Out of Food in the Last Year: Never true    Ran Out of Food in the Last Year: Never true  Transportation Needs: No Transportation Needs (07/23/2022)   PRAPARE - Administrator, Civil Service (Medical): No    Lack of Transportation (Non-Medical): No  Physical Activity: Insufficiently Active (07/23/2022)   Exercise Vital Sign    Days of Exercise per Week: 3 days    Minutes of Exercise per Session: 30 min  Stress: Stress Concern Present (07/23/2022)   [employer] of Occupational Health - Occupational Stress Questionnaire    Feeling of Stress : To some extent  Social Connections: Unknown (07/23/2022)   Social Connection and Isolation Panel [NHANES]    Frequency of Communication with Friends and Family: More than three times a week    Frequency of Social Gatherings with Friends and Family: Twice a week  Attends Religious Services: Patient declined    Active Member of Clubs or Organizations: Yes    Attends Banker Meetings: More than 4 times per year    Marital Status: Married  Intimate Partner Violence: Not on file    Outpatient Encounter Medications as of 09/20/2022  Medication Sig   FLUoxetine (PROZAC) 20 MG capsule Take 1 capsule (20 mg total) by mouth daily.   FLUoxetine (PROZAC) 40 MG capsule Take 1 capsule (40 mg total) by mouth daily.   IFEREX 150 150 MG capsule Take 150 mg by mouth 2 (two) times daily.   levothyroxine (SYNTHROID) 200 MCG tablet Take 1 tablet (200 mcg total) by mouth  daily.   levothyroxine (SYNTHROID) 50 MCG tablet Take 1 tablet (50 mcg total) by mouth daily.   QSYMIA 7.5-46 MG CP24 Take 1 capsule by mouth daily.   No facility-administered encounter medications on file as of 09/20/2022.    No Known Allergies  Review of Systems  Constitutional:  Positive for fatigue. Negative for activity change, appetite change, chills, diaphoresis, fever and unexpected weight change.  HENT: Negative.    Eyes: Negative.  Negative for photophobia and visual disturbance.  Respiratory:  Negative for cough, chest tightness and shortness of breath.   Cardiovascular:  Negative for chest pain, palpitations and leg swelling.  Gastrointestinal:  Negative for abdominal pain, blood in stool, constipation, diarrhea, nausea and vomiting.  Endocrine: Negative.   Genitourinary:  Negative for decreased urine volume, difficulty urinating, dysuria, frequency, urgency and vaginal bleeding.  Musculoskeletal:  Negative for arthralgias and myalgias.  Skin: Negative.   Allergic/Immunologic: Negative.   Neurological:  Positive for dizziness, weakness and light-headedness. Negative for tremors, seizures, syncope, facial asymmetry, speech difficulty, numbness and headaches.  Hematological: Negative.  Does not bruise/bleed easily.  Psychiatric/Behavioral:  Negative for confusion, hallucinations, sleep disturbance and suicidal ideas.   All other systems reviewed and are negative.       Objective:  Temp 97.7 F (36.5 C)   Ht 5\' 6"  (1.676 m)   Wt 247 lb 6.4 oz (112.2 kg)   SpO2 99%   BMI 39.93 kg/m    Wt Readings from Last 3 Encounters:  09/20/22 247 lb 6.4 oz (112.2 kg)  07/27/22 245 lb (111.1 kg)  04/25/22 234 lb 6.4 oz (106.3 kg)    Physical Exam Vitals and nursing note reviewed.  Constitutional:      General: She is not in acute distress.    Appearance: Normal appearance. She is well-developed and well-groomed. She is morbidly obese. She is not ill-appearing, toxic-appearing  or diaphoretic.  HENT:     Head: Normocephalic and atraumatic.     Jaw: There is normal jaw occlusion.     Right Ear: Hearing normal.     Left Ear: Hearing normal.     Nose: Nose normal.     Mouth/Throat:     Lips: Pink.     Mouth: Mucous membranes are moist.     Pharynx: Uvula midline.  Eyes:     General: Lids are normal.     Conjunctiva/sclera: Conjunctivae normal.     Pupils: Pupils are equal, round, and reactive to light.  Neck:     Trachea: Trachea and phonation normal.  Cardiovascular:     Rate and Rhythm: Normal rate and regular rhythm.     Chest Wall: PMI is not displaced.     Pulses: Normal pulses.     Heart sounds: Normal heart sounds. No murmur heard.  No friction rub. No gallop.  Pulmonary:     Effort: Pulmonary effort is normal. No respiratory distress.     Breath sounds: Normal breath sounds. No wheezing.  Abdominal:     General: There is no abdominal bruit.     Palpations: There is no hepatomegaly or splenomegaly.     Hernia: No hernia is present.  Musculoskeletal:     Cervical back: Normal range of motion and neck supple.  Skin:    General: Skin is warm and dry.     Capillary Refill: Capillary refill takes less than 2 seconds.     Coloration: Skin is not cyanotic, jaundiced or pale.     Findings: No rash.  Neurological:     General: No focal deficit present.     Mental Status: She is alert and oriented to person, place, and time.     Sensory: Sensation is intact.     Motor: Motor function is intact.     Coordination: Coordination is intact.     Gait: Gait is intact.     Deep Tendon Reflexes: Reflexes are normal and symmetric.  Psychiatric:        Attention and Perception: Attention and perception normal.        Mood and Affect: Mood and affect normal.        Speech: Speech normal.        Behavior: Behavior normal. Behavior is cooperative.        Thought Content: Thought content normal.        Cognition and Memory: Cognition and memory normal.         Judgment: Judgment normal.     Results for orders placed or performed in visit on 07/27/22  CMP14+EGFR  Result Value Ref Range   Glucose 73 70 - 99 mg/dL   BUN 12 6 - 20 mg/dL   Creatinine, Ser 9.81 0.57 - 1.00 mg/dL   eGFR 98 >19 JY/NWG/9.56   BUN/Creatinine Ratio 15 9 - 23   Sodium 140 134 - 144 mmol/L   Potassium 3.9 3.5 - 5.2 mmol/L   Chloride 104 96 - 106 mmol/L   CO2 24 20 - 29 mmol/L   Calcium 7.3 (L) 8.7 - 10.2 mg/dL   Total Protein 6.1 6.0 - 8.5 g/dL   Albumin 3.9 3.9 - 4.9 g/dL   Globulin, Total 2.2 1.5 - 4.5 g/dL   Albumin/Globulin Ratio 1.8 1.2 - 2.2   Bilirubin Total <0.2 0.0 - 1.2 mg/dL   Alkaline Phosphatase 98 44 - 121 IU/L   AST 16 0 - 40 IU/L   ALT 14 0 - 32 IU/L  CBC with Differential/Platelet  Result Value Ref Range   WBC 6.3 3.4 - 10.8 x10E3/uL   RBC 4.79 3.77 - 5.28 x10E6/uL   Hemoglobin 12.7 11.1 - 15.9 g/dL   Hematocrit 21.3 08.6 - 46.6 %   MCV 84 79 - 97 fL   MCH 26.5 (L) 26.6 - 33.0 pg   MCHC 31.8 31.5 - 35.7 g/dL   RDW 57.8 46.9 - 62.9 %   Platelets 245 150 - 450 x10E3/uL   Neutrophils 60 Not Estab. %   Lymphs 32 Not Estab. %   Monocytes 7 Not Estab. %   Eos 1 Not Estab. %   Basos 0 Not Estab. %   Neutrophils Absolute 3.7 1.4 - 7.0 x10E3/uL   Lymphocytes Absolute 2.0 0.7 - 3.1 x10E3/uL   Monocytes Absolute 0.5 0.1 - 0.9 x10E3/uL   EOS (ABSOLUTE) 0.1  0.0 - 0.4 x10E3/uL   Basophils Absolute 0.0 0.0 - 0.2 x10E3/uL   Immature Granulocytes 0 Not Estab. %   Immature Grans (Abs) 0.0 0.0 - 0.1 x10E3/uL  VITAMIN D 25 Hydroxy (Vit-D Deficiency, Fractures)  Result Value Ref Range   Vit D, 25-Hydroxy 18.2 (L) 30.0 - 100.0 ng/mL  Thyroid Panel With TSH  Result Value Ref Range   TSH 1.530 0.450 - 4.500 uIU/mL   T4, Total 7.2 4.5 - 12.0 ug/dL   T3 Uptake Ratio 30 24 - 39 %   Free Thyroxine Index 2.2 1.2 - 4.9       Pertinent labs & imaging results that were available during my care of the patient were reviewed by me and considered in my  medical decision making.  Assessment & Plan:  Jaydelin was seen today for fatigue.  Diagnoses and all orders for this visit:  Dizziness Light-headedness Malaise and fatigue Ongoing for 4 days, history of IDA. Will check below labs and treat if warranted.  -     Anemia Profile B -     CMP14+EGFR -     Thyroid Panel With TSH -     VITAMIN D 25 Hydroxy (Vit-D Deficiency, Fractures)     Continue all other maintenance medications.  Follow up plan: Return if symptoms worsen or fail to improve.   Continue healthy lifestyle choices, including diet (rich in fruits, vegetables, and lean proteins, and low in salt and simple carbohydrates) and exercise (at least 30 minutes of moderate physical activity daily).   The above assessment and management plan was discussed with the patient. The patient verbalized understanding of and has agreed to the management plan. Patient is aware to call the clinic if they develop any new symptoms or if symptoms persist or worsen. Patient is aware when to return to the clinic for a follow-up visit. Patient educated on when it is appropriate to go to the emergency department.   Kari Baars, FNP-C [facility] [phone]

## 2022-09-21 LAB — ANEMIA PROFILE B
Basophils Absolute: 0 10*3/uL (ref 0.0–0.2)
Basos: 0 %
EOS (ABSOLUTE): 0.1 10*3/uL (ref 0.0–0.4)
Eos: 1 %
Ferritin: 20 ng/mL (ref 15–150)
Folate: >20 ng/mL (ref >3.0)
Hematocrit: 42.6 % (ref 34.0–46.6)
Hemoglobin: 13.8 g/dL (ref 11.1–15.9)
Immature Grans (Abs): 0 10*3/uL (ref 0.0–0.1)
Immature Granulocytes: 0 %
Iron Saturation: 30 % (ref 15–55)
Iron: 95 ug/dL (ref 27–159)
Lymphocytes Absolute: 1.9 10*3/uL (ref 0.7–3.1)
Lymphs: 29 %
MCH: 26.9 pg (ref 26.6–33.0)
MCHC: 32.4 g/dL (ref 31.5–35.7)
MCV: 83 fL (ref 79–97)
Monocytes Absolute: 0.4 10*3/uL (ref 0.1–0.9)
Monocytes: 6 %
Neutrophils Absolute: 4.3 10*3/uL (ref 1.4–7.0)
Neutrophils: 64 %
Platelets: 262 10*3/uL (ref 150–450)
RBC: 5.13 x10E6/uL (ref 3.77–5.28)
RDW: 14.1 % (ref 11.7–15.4)
Retic Ct Pct: 1.2 % (ref 0.6–2.6)
Total Iron Binding Capacity: 320 ug/dL (ref 250–450)
UIBC: 225 ug/dL (ref 131–425)
Vitamin B-12: 386 pg/mL (ref 232–1245)
WBC: 6.7 10*3/uL (ref 3.4–10.8)

## 2022-09-21 LAB — CMP14+EGFR
ALT: 12 IU/L (ref 0–32)
AST: 14 IU/L (ref 0–40)
Albumin: 4.3 g/dL (ref 3.9–4.9)
Alkaline Phosphatase: 117 IU/L (ref 44–121)
BUN/Creatinine Ratio: 21 (ref 9–23)
BUN: 19 mg/dL (ref 6–20)
Bilirubin Total: 0.4 mg/dL (ref 0.0–1.2)
CO2: 21 mmol/L (ref 20–29)
Calcium: 8.7 mg/dL (ref 8.7–10.2)
Chloride: 103 mmol/L (ref 96–106)
Creatinine, Ser: 0.91 mg/dL (ref 0.57–1.00)
Globulin, Total: 2.5 g/dL (ref 1.5–4.5)
Glucose: 68 mg/dL — ABNORMAL LOW (ref 70–99)
Potassium: 4.1 mmol/L (ref 3.5–5.2)
Sodium: 138 mmol/L (ref 134–144)
Total Protein: 6.8 g/dL (ref 6.0–8.5)
eGFR: 84 mL/min/{1.73_m2} (ref >59)

## 2022-09-21 LAB — THYROID PANEL WITH TSH
Free Thyroxine Index: 3.2 (ref 1.2–4.9)
T3 Uptake Ratio: 32 % (ref 24–39)
T4, Total: 10.1 ug/dL (ref 4.5–12.0)
TSH: 0.403 u[IU]/mL — ABNORMAL LOW (ref 0.450–4.500)

## 2022-09-21 LAB — VITAMIN D 25 HYDROXY (VIT D DEFICIENCY, FRACTURES): Vit D, 25-Hydroxy: 27.6 ng/mL — ABNORMAL LOW (ref 30.0–100.0)

## 2022-10-10 ENCOUNTER — Telehealth: Payer: Self-pay

## 2022-10-10 NOTE — Transitions of Care (Post Inpatient/ED Visit) (Signed)
   10/10/2022  Name: Jennifer Rosales MRN: [id number] DOB: Aug 05, 1985  Today's TOC FU Call Status: Today's TOC FU Call Status:: Successful TOC FU Call Competed TOC FU Call Complete Date: 10/10/22  Transition Care Management Follow-up Telephone Call Date of Discharge: 10/09/22 Discharge Facility: Other ([facility]) Name of Other ([facility]) Discharge Facility: [facility] Type of Discharge: Emergency Department Reason for ED Visit: Other: (syncope) How have you been since you were released from the hospital?: Better Any questions or concerns?: No  Items Reviewed: Did you receive and understand the discharge instructions provided?: No Medications obtained,verified, and reconciled?: Yes (Medications Reviewed) Any new allergies since your discharge?: No Dietary orders reviewed?: Yes Do you have support at home?: Yes People in Home: spouse  Medications Reviewed Today: Medications Reviewed Today     Reviewed by [name], LPN (Licensed Practical Nurse) on 10/10/22 at 1013  Med List Status: <None>   Medication Order Taking? Sig Documenting Provider Last Dose Status Informant  FLUoxetine (PROZAC) 20 MG capsule [id number] No Take 1 capsule (20 mg total) by mouth daily. Sonny Masters, FNP Taking Active   FLUoxetine (PROZAC) 40 MG capsule [id number] No Take 1 capsule (40 mg total) by mouth daily. Sonny Masters, FNP Taking Active   IFEREX 150 150 MG capsule [id number] No Take 150 mg by mouth 2 (two) times daily. [provider] Taking Active   levothyroxine (SYNTHROID) 200 MCG tablet [id number] No Take 1 tablet (200 mcg total) by mouth daily. Sonny Masters, FNP Taking Active   levothyroxine (SYNTHROID) 50 MCG tablet [id number] No Take 1 tablet (50 mcg total) by mouth daily. Sonny Masters, FNP Taking Active   QSYMIA 7.5-46 MG CP24 [id number]  Take 1 capsule by mouth daily. [provider]  Active             Home Care and Equipment/Supplies: Were Home Health  Services Ordered?: NA Any new equipment or medical supplies ordered?: NA  Functional Questionnaire: Do you need assistance with bathing/showering or dressing?: No Do you need assistance with meal preparation?: No Do you need assistance with eating?: No Do you need assistance with getting out of bed/getting out of a chair/moving?: No Do you have difficulty managing or taking your medications?: No  Follow up appointments reviewed: PCP Follow-up appointment confirmed?: Yes Date of PCP follow-up appointment?: 10/24/22 Follow-up Provider: [facility] Follow-up appointment confirmed?: NA Do you need transportation to your follow-up appointment?: No Do you understand care options if your condition(s) worsen?: Yes-patient verbalized understanding    SIGNATURE [name], LPN [facility] Nurse Health Advisor Direct Dial [phone]

## 2022-10-17 ENCOUNTER — Other Ambulatory Visit: Payer: BC Managed Care – PPO

## 2022-10-17 ENCOUNTER — Other Ambulatory Visit (INDEPENDENT_AMBULATORY_CARE_PROVIDER_SITE_OTHER): Payer: BC Managed Care – PPO

## 2022-10-17 ENCOUNTER — Encounter: Payer: Self-pay | Admitting: Family Medicine

## 2022-10-17 ENCOUNTER — Ambulatory Visit: Payer: BC Managed Care – PPO

## 2022-10-17 ENCOUNTER — Ambulatory Visit (INDEPENDENT_AMBULATORY_CARE_PROVIDER_SITE_OTHER): Payer: BC Managed Care – PPO | Admitting: Family Medicine

## 2022-10-17 VITALS — BP 122/89 | HR 76 | Temp 97.9°F | Wt 246.0 lb

## 2022-10-17 DIAGNOSIS — R42 Dizziness and giddiness: Secondary | ICD-10-CM

## 2022-10-17 LAB — BAYER DCA HB A1C WAIVED: HB A1C (BAYER DCA - WAIVED): 5 % (ref 4.8–5.6)

## 2022-10-17 NOTE — Progress Notes (Signed)
Acute Office Visit  Subjective:  Patient ID: Jennifer Rosales, female    DOB: 10-27-1985, 37 y.o.   MRN: [id number]  Chief Complaint  Patient presents with   Dizziness    Dizziness, feels faint, rt arm tingling - patient felt the same way Sunday before passing out    Dizziness  Patient is in today for dizziness and feels faint. States that she felt the same way on Sunday before passing out. States that she passed out at the hospital while in the ED with her son and they evaluated her. States that she had tingling in her right hand and thought it was related to BG. Reports that she got a coke slushie and she felt worse. She has been increasing her water intake. Reports that dizzy spells started approximately 6 weeks ago. States that when she closes her eyes she has floaters. Endorses that the world feels like it spinning around her.  Last week was the first time that she passed out. Reports that she still feels slightly dizzy.  Reports that she is not currently taking an iron supplement. She is taking her vitamin D supplement. Reports a history of vertigo with her son. States that it is worse with driving and sometimes feels as if her "eyes dilate" while she is driving.   Review of Systems  Neurological:  Positive for dizziness.    Objective:  BP 122/89   Pulse 76   Temp 97.9 F (36.6 C)   Wt 246 lb (111.6 kg)   LMP 09/27/2022 (Approximate)   SpO2 95%   BMI 39.71 kg/m   Physical Exam Constitutional:      General: She is awake. She is not in acute distress.    Appearance: Normal appearance. She is well-developed and well-groomed. She is morbidly obese. She is not ill-appearing, toxic-appearing or diaphoretic.  Eyes:     Extraocular Movements:     Right eye: Nystagmus present.     Left eye: Nystagmus present.  Cardiovascular:     Rate and Rhythm: Normal rate.     Pulses: Normal pulses.          Radial pulses are 2+ on the right side and 2+ on the left side.       Posterior  tibial pulses are 2+ on the right side and 2+ on the left side.     Heart sounds: Normal heart sounds. No murmur heard.    No gallop.  Pulmonary:     Effort: Pulmonary effort is normal. No respiratory distress.     Breath sounds: Normal breath sounds. No stridor. No wheezing, rhonchi or rales.  Musculoskeletal:     Cervical back: Full passive range of motion without pain and neck supple.     Right lower leg: No edema.     Left lower leg: No edema.  Skin:    General: Skin is warm.     Capillary Refill: Capillary refill takes less than 2 seconds.  Neurological:     General: No focal deficit present.     Mental Status: She is alert, oriented to person, place, and time and easily aroused. Mental status is at baseline.     GCS: GCS eye subscore is 4. GCS verbal subscore is 5. GCS motor subscore is 6.     Motor: No weakness.  Psychiatric:        Attention and Perception: Attention and perception normal.        Mood and Affect: Mood and affect normal.  Speech: Speech normal.        Behavior: Behavior normal. Behavior is cooperative.        Thought Content: Thought content normal. Thought content does not include homicidal or suicidal ideation. Thought content does not include homicidal or suicidal plan.        Cognition and Memory: Cognition and memory normal.        Judgment: Judgment normal.    Assessment & Plan:  1. Dizziness Labs as below. Will communicate results to patient once available.  Will refer to PT as below to start vestibular rehab. Discussed with patient OTC treatment options for vertigo.  Negative for orthostatics during exam  - Ambulatory referral to Physical Therapy  2. Light-headedness Labs as below. Will communicate results to patient once available.  Will complete heart monitor as below to rule out additional causes.  - CBC with Differential/Platelet - BMP8+EGFR - LONG TERM MONITOR (3-14 DAYS); Future - Ambulatory referral to Physical Therapy  The above  assessment and management plan was discussed with the patient. The patient verbalized understanding of and has agreed to the management plan using shared-decision making. Patient is aware to call the clinic if they develop any new symptoms or if symptoms fail to improve or worsen. Patient is aware when to return to the clinic for a follow-up visit. Patient educated on when it is appropriate to go to the emergency department.   Return if symptoms worsen or fail to improve.  Neale Burly, DNP-FNP [facility] [address] [place], [place] [address] [phone]

## 2022-10-18 ENCOUNTER — Ambulatory Visit: Payer: BC Managed Care – PPO

## 2022-10-18 LAB — CBC WITH DIFFERENTIAL/PLATELET
Basophils Absolute: 0 10*3/uL (ref 0.0–0.2)
Basos: 0 %
EOS (ABSOLUTE): 0.1 10*3/uL (ref 0.0–0.4)
Eos: 1 %
Hematocrit: 43.9 % (ref 34.0–46.6)
Hemoglobin: 13.8 g/dL (ref 11.1–15.9)
Immature Grans (Abs): 0 10*3/uL (ref 0.0–0.1)
Immature Granulocytes: 0 %
Lymphocytes Absolute: 2.4 10*3/uL (ref 0.7–3.1)
Lymphs: 29 %
MCH: 25.9 pg — ABNORMAL LOW (ref 26.6–33.0)
MCHC: 31.4 g/dL — ABNORMAL LOW (ref 31.5–35.7)
MCV: 83 fL (ref 79–97)
Monocytes Absolute: 0.6 10*3/uL (ref 0.1–0.9)
Monocytes: 8 %
Neutrophils Absolute: 5 10*3/uL (ref 1.4–7.0)
Neutrophils: 62 %
Platelets: 269 10*3/uL (ref 150–450)
RBC: 5.32 x10E6/uL — ABNORMAL HIGH (ref 3.77–5.28)
RDW: 14.4 % (ref 11.7–15.4)
WBC: 8.1 10*3/uL (ref 3.4–10.8)

## 2022-10-18 LAB — BMP8+EGFR
BUN/Creatinine Ratio: 18 (ref 9–23)
BUN: 15 mg/dL (ref 6–20)
CO2: 20 mmol/L (ref 20–29)
Calcium: 8.5 mg/dL — ABNORMAL LOW (ref 8.7–10.2)
Chloride: 103 mmol/L (ref 96–106)
Creatinine, Ser: 0.85 mg/dL (ref 0.57–1.00)
Glucose: 86 mg/dL (ref 70–99)
Potassium: 3.9 mmol/L (ref 3.5–5.2)
Sodium: 140 mmol/L (ref 134–144)
eGFR: 92 mL/min/{1.73_m2} (ref >59)

## 2022-10-18 NOTE — Progress Notes (Signed)
Slight abnormalities on CBC, some variations in labs are expected. Not anemic. Slightly decreased calcium, stable to prior. A1C normal, not diabetic. Will have patient follow up with PCP

## 2022-10-21 ENCOUNTER — Encounter: Payer: Self-pay | Admitting: Family Medicine

## 2022-10-24 ENCOUNTER — Encounter: Payer: Self-pay | Admitting: Family Medicine

## 2022-10-24 ENCOUNTER — Ambulatory Visit: Payer: BC Managed Care – PPO | Admitting: Family Medicine

## 2022-10-24 DIAGNOSIS — E890 Postprocedural hypothyroidism: Secondary | ICD-10-CM

## 2022-10-24 DIAGNOSIS — E0500 Thyrotoxicosis with diffuse goiter without thyrotoxic crisis or storm: Secondary | ICD-10-CM | POA: Diagnosis not present

## 2022-10-24 MED ORDER — LEVOTHYROXINE SODIUM 200 MCG PO TABS
200.0000 ug | ORAL_TABLET | Freq: Every day | ORAL | 1 refills | Status: DC
Start: 2022-10-24 — End: 2022-10-26

## 2022-10-24 MED ORDER — LEVOTHYROXINE SODIUM 50 MCG PO TABS
50.0000 ug | ORAL_TABLET | Freq: Every day | ORAL | 1 refills | Status: DC
Start: 2022-10-24 — End: 2022-10-26

## 2022-10-24 NOTE — Progress Notes (Signed)
Subjective:  Patient ID: Jennifer Rosales, female    DOB: 1985-09-06, 37 y.o.   MRN: [id number]  Patient Care Team: Sonny Masters, FNP as PCP - General (Family Medicine) Gerald Leitz, MD (Obstetrics and Gynecology) Danella Maiers, [facility] as Pharmacist (Family Medicine)   Chief Complaint:  Medical Management of Chronic Issues (6 month follow up )   HPI: Jennifer Rosales is a 37 y.o. female presenting on 10/24/2022 for Medical Management of Chronic Issues (6 month follow up )    1. Graves disease 2. S/P total thyroidectomy  Diagnosed with Graves in 2019. Total Throidectomy in march of 2022 Compliant with medications - No Current medications - synthroid Adverse side effects - No Weight - up 3 lbs in last month  Bowel habit changes - No Heat or cold intolerance - No Mood changes - Yes Changes in sleep habits - Yes sleeping poorly tosses and turns Fatigue - Yes tired all the time  Skin, hair, or nail changes - No Tremor - Yes Palpitations - Yes Edema - No Shortness of breath - Yes pt gets winded with ADLs of laundry and dishes and house work Changes in menses - No LMP started yesterday Relevant past medical, surgical, family, and social history reviewed and updated as indicated.  Allergies and medications reviewed and updated. Data reviewed: Chart in Epic.   Past Medical History:  Diagnosis Date   Anemia    Anxiety    Depression, major, single episode, mild (HCC) 08/17/2021   Graves disease    Graves disease    Obesity     Past Surgical History:  Procedure Laterality Date   CHOLECYSTECTOMY     GALLBLADDER SURGERY  2008   LAPAROSCOPIC GASTRIC SLEEVE RESECTION     THYROIDECTOMY  06/01/2020   TONSILLECTOMY     TONSILLECTOMY AND ADENOIDECTOMY     WISDOM TOOTH EXTRACTION      Social History   Socioeconomic History   Marital status: Married    Spouse name: Allayne Butcher   Number of children: 3   Years of education: Not on file   Highest education level: Associate  degree: academic program  Occupational History   Not on file  Tobacco Use   Smoking status: Never   Smokeless tobacco: Never  Vaping Use   Vaping status: Never Used  Substance and Sexual Activity   Alcohol use: Not Currently   Drug use: Not Currently   Sexual activity: Yes  Other Topics Concern   Not on file  Social History Narrative   ** Merged History Encounter **       Social Determinants of Health   Financial Resource Strain: Low Risk  (09/08/2022)   Received from [facility], [facility]   Overall Financial Resource Strain (CARDIA)    Difficulty of Paying Living Expenses: Not hard at all  Food Insecurity: No Food Insecurity (09/08/2022)   Received from [facility], [facility]   Hunger Vital Sign    Worried About Running Out of Food in the Last Year: Never true    Ran Out of Food in the Last Year: Never true  Transportation Needs: No Transportation Needs (09/08/2022)   Received from [facility], [facility]   PRAPARE - Transportation    Lack of Transportation (Medical): No    Lack of Transportation (Non-Medical): No  Physical Activity: Insufficiently Active (09/08/2022)   Received from [facility], [facility]   Exercise Vital Sign    Days of Exercise  per Week: 3 days    Minutes of Exercise per Session: 30 min  Stress: No Stress Concern Present (09/08/2022)   Received from [facility], [facility] of Occupational Health - Occupational Stress Questionnaire    Feeling of Stress : Not at all  Recent Concern: Stress - Stress Concern Present (07/23/2022)   [employer] of Occupational Health - Occupational Stress Questionnaire    Feeling of Stress : To some extent  Social Connections: Socially Integrated (09/08/2022)   Received from [facility], [facility]   Social Network    How would you rate your social network (family, work, friends)?: Good participation with social networks  Intimate Partner Violence: Not At Risk (09/08/2022)    Received from [facility], [facility]   HITS    Over the last 12 months how often did your partner physically hurt you?: 1    Over the last 12 months how often did your partner insult you or talk down to you?: 1    Over the last 12 months how often did your partner threaten you with physical harm?: 1    Over the last 12 months how often did your partner scream or curse at you?: 1    Outpatient Encounter Medications as of 10/24/2022  Medication Sig   FLUoxetine (PROZAC) 20 MG capsule Take 1 capsule (20 mg total) by mouth daily.   FLUoxetine (PROZAC) 40 MG capsule Take 1 capsule (40 mg total) by mouth daily.   IFEREX 150 150 MG capsule Take 150 mg by mouth 2 (two) times daily.   QSYMIA 7.5-46 MG CP24 Take 1 capsule by mouth daily.   [DISCONTINUED] levothyroxine (SYNTHROID) 200 MCG tablet Take 1 tablet (200 mcg total) by mouth daily.   [DISCONTINUED] levothyroxine (SYNTHROID) 50 MCG tablet Take 1 tablet (50 mcg total) by mouth daily.   levothyroxine (SYNTHROID) 200 MCG tablet Take 1 tablet (200 mcg total) by mouth daily.   levothyroxine (SYNTHROID) 50 MCG tablet Take 1 tablet (50 mcg total) by mouth daily.   No facility-administered encounter medications on file as of 10/24/2022.    No Known Allergies  ROS per HPI, otherwise unremarkable      Objective:  BP 113/77   Pulse 83   Temp 97.6 F (36.4 C) (Temporal)   Ht 5\' 6"  (1.676 m)   Wt 250 lb 3.2 oz (113.5 kg)   LMP 10/23/2022 (Approximate)   SpO2 99%   BMI 40.38 kg/m    Wt Readings from Last 3 Encounters:  10/24/22 250 lb 3.2 oz (113.5 kg)  10/17/22 246 lb (111.6 kg)  09/20/22 247 lb 6.4 oz (112.2 kg)    Physical Exam Vitals and nursing note reviewed.  Constitutional:      General: She is not in acute distress.    Appearance: Normal appearance. She is well-developed and well-groomed. She is morbidly obese. She is not ill-appearing, toxic-appearing or diaphoretic.  HENT:     Head: Normocephalic and atraumatic.      Jaw: There is normal jaw occlusion.     Right Ear: Hearing normal.     Left Ear: Hearing normal.     Nose: Nose normal.     Mouth/Throat:     Lips: Pink.     Mouth: Mucous membranes are moist.     Pharynx: Oropharynx is clear. Uvula midline.  Eyes:     General: Lids are normal.     Extraocular Movements: Extraocular movements intact.     Conjunctiva/sclera: Conjunctivae  normal.     Pupils: Pupils are equal, round, and reactive to light.  Neck:     Trachea: Trachea and phonation normal.     Comments: Thyroidectomy scar well healed Cardiovascular:     Rate and Rhythm: Normal rate and regular rhythm.     Chest Wall: PMI is not displaced.     Pulses: Normal pulses.     Heart sounds: Normal heart sounds. No murmur heard.    No friction rub. No gallop.  Pulmonary:     Effort: Pulmonary effort is normal. No respiratory distress.     Breath sounds: Normal breath sounds. No wheezing.  Abdominal:     General: Bowel sounds are normal. There is no distension or abdominal bruit.     Palpations: Abdomen is soft. There is no hepatomegaly or splenomegaly.     Tenderness: There is no abdominal tenderness. There is no right CVA tenderness or left CVA tenderness.     Hernia: No hernia is present.  Musculoskeletal:        General: Normal range of motion.     Cervical back: Normal range of motion and neck supple.     Right lower leg: No edema.     Left lower leg: No edema.  Skin:    General: Skin is warm and dry.     Capillary Refill: Capillary refill takes less than 2 seconds.     Coloration: Skin is not cyanotic, jaundiced or pale.     Findings: No rash.  Neurological:     General: No focal deficit present.     Mental Status: She is alert and oriented to person, place, and time.     Sensory: Sensation is intact.     Motor: Motor function is intact.     Coordination: Coordination is intact.     Gait: Gait is intact.     Deep Tendon Reflexes: Reflexes are normal and symmetric.   Psychiatric:        Attention and Perception: Attention and perception normal.        Mood and Affect: Mood and affect normal.        Speech: Speech normal.        Behavior: Behavior normal. Behavior is cooperative.        Thought Content: Thought content normal.        Cognition and Memory: Cognition and memory normal.        Judgment: Judgment normal.     Results for orders placed or performed in visit on 10/17/22  CBC with Differential/Platelet  Result Value Ref Range   WBC 8.1 3.4 - 10.8 x10E3/uL   RBC 5.32 (H) 3.77 - 5.28 x10E6/uL   Hemoglobin 13.8 11.1 - 15.9 g/dL   Hematocrit 65.7 84.6 - 46.6 %   MCV 83 79 - 97 fL   MCH 25.9 (L) 26.6 - 33.0 pg   MCHC 31.4 (L) 31.5 - 35.7 g/dL   RDW 96.2 95.2 - 84.1 %   Platelets 269 150 - 450 x10E3/uL   Neutrophils 62 Not Estab. %   Lymphs 29 Not Estab. %   Monocytes 8 Not Estab. %   Eos 1 Not Estab. %   Basos 0 Not Estab. %   Neutrophils Absolute 5.0 1.4 - 7.0 x10E3/uL   Lymphocytes Absolute 2.4 0.7 - 3.1 x10E3/uL   Monocytes Absolute 0.6 0.1 - 0.9 x10E3/uL   EOS (ABSOLUTE) 0.1 0.0 - 0.4 x10E3/uL   Basophils Absolute 0.0 0.0 - 0.2 x10E3/uL  Immature Granulocytes 0 Not Estab. %   Immature Grans (Abs) 0.0 0.0 - 0.1 x10E3/uL  BMP8+EGFR  Result Value Ref Range   Glucose 86 70 - 99 mg/dL   BUN 15 6 - 20 mg/dL   Creatinine, Ser 4.09 0.57 - 1.00 mg/dL   eGFR 92 >81 XB/JYN/8.29   BUN/Creatinine Ratio 18 9 - 23   Sodium 140 134 - 144 mmol/L   Potassium 3.9 3.5 - 5.2 mmol/L   Chloride 103 96 - 106 mmol/L   CO2 20 20 - 29 mmol/L   Calcium 8.5 (L) 8.7 - 10.2 mg/dL  Bayer DCA Hb F6O Waived  Result Value Ref Range   HB A1C (BAYER DCA - WAIVED) 5.0 4.8 - 5.6 %       Pertinent labs & imaging results that were available during my care of the patient were reviewed by me and considered in my medical decision making.  Assessment & Plan:  Jennifer Rosales was seen today for medical management of chronic issues.  Diagnoses and all orders for  this visit:  Graves disease -     levothyroxine (SYNTHROID) 200 MCG tablet; Take 1 tablet (200 mcg total) by mouth daily. -     levothyroxine (SYNTHROID) 50 MCG tablet; Take 1 tablet (50 mcg total) by mouth daily. -     Thyroid Panel With TSH -     T4, Free -     T3, Free  S/P total thyroidectomy -     levothyroxine (SYNTHROID) 200 MCG tablet; Take 1 tablet (200 mcg total) by mouth daily. -     levothyroxine (SYNTHROID) 50 MCG tablet; Take 1 tablet (50 mcg total) by mouth daily. -     Thyroid Panel With TSH -     T4, Free -     T3, Free Thyroid disease has been poorly controlled. Labs are pending. Adjustments to regimen will be made if warranted. Make sure to take medications on an empty stomach with a full glass of water. Make sure to avoid vitamins or supplements for at least 4 hours before and 4 hours after taking medications. Repeat labs in 3 months if adjustments are made and in 6 months if stable.     Continue all other maintenance medications.  Follow up plan: Follow up in 3 months for thyroid levels and possible adjustments.   Continue healthy lifestyle choices, including diet (rich in fruits, vegetables, and lean proteins, and low in salt and simple carbohydrates) and exercise (at least 30 minutes of moderate physical activity daily).  Educational handout given for dash diet  The above assessment and management plan was discussed with the patient. The patient verbalized understanding of and has agreed to the management plan. Patient is aware to call the clinic if they develop any new symptoms or if symptoms persist or worsen. Patient is aware when to return to the clinic for a follow-up visit. Patient educated on when it is appropriate to go to the emergency department.   Maryelizabeth Kaufmann NP Student [place] [facility] [phone]   I personally was present during the history, physical exam, and medical decision-making activities of this visit and have  verified that the services and findings are accurately documented in the nurse practitioner student's note.  Kari Baars, FNP-C [facility] [address] [place], [place] [address] [phone]

## 2022-10-24 NOTE — Patient Instructions (Signed)
Start taking synthroid at bedtime      Goal BP:  For patients younger than 60: Goal BP < 140/90. For patients 60 and older: Goal BP < 150/90. For patients with diabetes: Goal BP < 140/90.  Take your medications faithfully as prescribed. Maintain a healthy weight. Get at least 150 minutes of aerobic exercise per week. Minimize salt intake, less than 2000 mg per day. Minimize alcohol intake.  DASH Eating Plan DASH stands for "Dietary Approaches to Stop Hypertension." The DASH eating plan is a healthy eating plan that has been shown to reduce high blood pressure (hypertension). Additional health benefits may include reducing the risk of type 2 diabetes mellitus, heart disease, and stroke. The DASH eating plan may also help with weight loss.  WHAT DO I NEED TO KNOW ABOUT THE DASH EATING PLAN? For the DASH eating plan, you will follow these general guidelines: Choose foods with a percent daily value for sodium of less than 5% (as listed on the food label). Use salt-free seasonings or herbs instead of table salt or sea salt. Check with your health care provider or pharmacist before using salt substitutes. Eat lower-sodium products, often labeled as "lower sodium" or "no salt added." Eat fresh foods. Eat more vegetables, fruits, and low-fat dairy products. Choose whole grains. Look for the word "whole" as the first word in the ingredient list. Choose fish and skinless chicken or [place] more often than red meat. Limit fish, poultry, and meat to 6 oz (170 g) each day. Limit sweets, desserts, sugars, and sugary drinks. Choose heart-healthy fats. Limit cheese to 1 oz (28 g) per day. Eat more home-cooked food and less restaurant, buffet, and fast food. Limit fried foods. Cook foods using methods other than frying. Limit canned vegetables. If you do use them, rinse them well to decrease the sodium. When eating at a restaurant, ask that your food be prepared with less salt, or no salt if  possible.  WHAT FOODS CAN I EAT? Seek help from a dietitian for individual calorie needs.  Grains Whole grain or whole wheat bread. Brown rice. Whole grain or whole wheat pasta. Quinoa, bulgur, and whole grain cereals. Low-sodium cereals. Corn or whole wheat flour tortillas. Whole grain cornbread. Whole grain crackers. Low-sodium crackers.  Vegetables Fresh or frozen vegetables (raw, steamed, roasted, or grilled). Low-sodium or reduced-sodium tomato and vegetable juices. Low-sodium or reduced-sodium tomato sauce and paste. Low-sodium or reduced-sodium canned vegetables.   Fruits All fresh, canned (in natural juice), or frozen fruits.  Meat and Other Protein Products Ground beef (85% or leaner), grass-fed beef, or beef trimmed of fat. Skinless chicken or [place]. Ground chicken or [place]. Pork trimmed of fat. All fish and seafood. Eggs. Dried beans, peas, or lentils. Unsalted nuts and seeds. Unsalted canned beans.  Dairy Low-fat dairy products, such as skim or 1% milk, 2% or reduced-fat cheeses, low-fat ricotta or cottage cheese, or plain low-fat yogurt. Low-sodium or reduced-sodium cheeses.  Fats and Oils Tub margarines without trans fats. Light or reduced-fat mayonnaise and salad dressings (reduced sodium). Avocado. Safflower, olive, or canola oils. Natural peanut or almond butter.  Other Unsalted popcorn and pretzels. The items listed above may not be a complete list of recommended foods or beverages. Contact your dietitian for more options.  WHAT FOODS ARE NOT RECOMMENDED?  Grains White bread. White pasta. White rice. Refined cornbread. Bagels and croissants. Crackers that contain trans fat.  Vegetables Creamed or fried vegetables. Vegetables in a cheese sauce. Regular canned vegetables. Regular canned  tomato sauce and paste. Regular tomato and vegetable juices.  Fruits Dried fruits. Canned fruit in light or heavy syrup. Fruit juice.  Meat and Other Protein Products Fatty  cuts of meat. Ribs, chicken wings, bacon, sausage, bologna, salami, chitterlings, fatback, hot dogs, bratwurst, and packaged luncheon meats. Salted nuts and seeds. Canned beans with salt.  Dairy Whole or 2% milk, cream, half-and-half, and cream cheese. Whole-fat or sweetened yogurt. Full-fat cheeses or blue cheese. Nondairy creamers and whipped toppings. Processed cheese, cheese spreads, or cheese curds.  Condiments Onion and garlic salt, seasoned salt, table salt, and sea salt. Canned and packaged gravies. Worcestershire sauce. Tartar sauce. Barbecue sauce. Teriyaki sauce. Soy sauce, including reduced sodium. Steak sauce. Fish sauce. Oyster sauce. Cocktail sauce. Horseradish. Ketchup and mustard. Meat flavorings and tenderizers. Bouillon cubes. Hot sauce. Tabasco sauce. Marinades. Taco seasonings. Relishes.  Fats and Oils Butter, stick margarine, lard, shortening, ghee, and bacon fat. Coconut, palm kernel, or palm oils. Regular salad dressings.  Other Pickles and olives. Salted popcorn and pretzels.  The items listed above may not be a complete list of foods and beverages to avoid. Contact your dietitian for more information.  WHERE CAN I FIND MORE INFORMATION? National Heart, Lung, and Blood Institute: [employer] Document Released: 03/09/2011 Document Revised: 08/04/2013 Document Reviewed: 01/22/2013 [facility] Patient Information 2015 [place], [place]. This information is not intended to replace advice given to you by your health care provider. Make sure you discuss any questions you have with your health care provider.   I think that you would greatly benefit from seeing a nutritionist.  If you are interested, please call Dr. Gerilyn Pilgrim at [phone] to schedule an appointment.

## 2022-10-25 ENCOUNTER — Encounter: Payer: Self-pay | Admitting: Family Medicine

## 2022-10-25 LAB — T3, FREE: T3, Free: 1.5 pg/mL — ABNORMAL LOW (ref 2.0–4.4)

## 2022-10-25 LAB — THYROID PANEL WITH TSH
Free Thyroxine Index: 1.4 (ref 1.2–4.9)
T3 Uptake Ratio: 26 % (ref 24–39)
T4, Total: 5.2 ug/dL (ref 4.5–12.0)
TSH: 12.2 u[IU]/mL — ABNORMAL HIGH (ref 0.450–4.500)

## 2022-10-25 LAB — T4, FREE: Free T4: 0.81 ng/dL — ABNORMAL LOW (ref 0.82–1.77)

## 2022-10-26 MED ORDER — SYNTHROID 300 MCG PO TABS
300.0000 ug | ORAL_TABLET | Freq: Every day | ORAL | 1 refills | Status: DC
Start: 2022-10-26 — End: 2023-05-01

## 2022-10-26 NOTE — Addendum Note (Signed)
Addended by: Sonny Masters on: 10/26/2022 08:10 AM   Modules accepted: Orders

## 2022-11-06 ENCOUNTER — Encounter: Payer: Self-pay | Admitting: Family Medicine

## 2022-11-07 NOTE — Progress Notes (Signed)
No significant findings on Zio. Follow up if symptoms continue.

## 2022-11-08 ENCOUNTER — Ambulatory Visit (INDEPENDENT_AMBULATORY_CARE_PROVIDER_SITE_OTHER): Payer: BC Managed Care – PPO

## 2022-11-08 DIAGNOSIS — Z111 Encounter for screening for respiratory tuberculosis: Secondary | ICD-10-CM

## 2022-11-08 NOTE — Progress Notes (Signed)
Tb skin test given in patients right arm and tolerated well.

## 2022-11-10 ENCOUNTER — Ambulatory Visit: Payer: BC Managed Care – PPO

## 2022-11-13 ENCOUNTER — Other Ambulatory Visit: Payer: BC Managed Care – PPO

## 2022-11-13 DIAGNOSIS — Z23 Encounter for immunization: Secondary | ICD-10-CM

## 2022-11-24 ENCOUNTER — Ambulatory Visit (HOSPITAL_COMMUNITY): Payer: Medicaid Other

## 2022-12-11 ENCOUNTER — Other Ambulatory Visit (HOSPITAL_COMMUNITY): Payer: Self-pay | Admitting: Family Medicine

## 2022-12-11 ENCOUNTER — Inpatient Hospital Stay
Admission: RE | Admit: 2022-12-11 | Discharge: 2022-12-11 | Disposition: A | Discharge status: Home / Self Care | Payer: Self-pay | Source: Ambulatory Visit | Attending: Family Medicine | Admitting: Family Medicine

## 2022-12-11 ENCOUNTER — Ambulatory Visit (HOSPITAL_COMMUNITY)
Admission: RE | Admit: 2022-12-11 | Discharge: 2022-12-11 | Disposition: A | Discharge status: Home / Self Care | Payer: BC Managed Care – PPO | Source: Ambulatory Visit | Attending: Diagnostic Radiology | Admitting: Diagnostic Radiology

## 2022-12-11 ENCOUNTER — Encounter (HOSPITAL_COMMUNITY): Payer: Self-pay

## 2022-12-11 DIAGNOSIS — Z1231 Encounter for screening mammogram for malignant neoplasm of breast: Secondary | ICD-10-CM

## 2023-01-24 ENCOUNTER — Ambulatory Visit: Payer: BC Managed Care – PPO | Admitting: Family Medicine

## 2023-01-24 ENCOUNTER — Encounter: Payer: Self-pay | Admitting: Family Medicine

## 2023-01-24 VITALS — BP 116/84 | HR 88 | Temp 97.7°F | Ht 66.0 in | Wt 253.2 lb

## 2023-01-24 DIAGNOSIS — E890 Postprocedural hypothyroidism: Secondary | ICD-10-CM | POA: Diagnosis not present

## 2023-01-24 DIAGNOSIS — E559 Vitamin D deficiency, unspecified: Secondary | ICD-10-CM

## 2023-01-24 DIAGNOSIS — F411 Generalized anxiety disorder: Secondary | ICD-10-CM

## 2023-01-24 DIAGNOSIS — E0500 Thyrotoxicosis with diffuse goiter without thyrotoxic crisis or storm: Secondary | ICD-10-CM

## 2023-01-24 DIAGNOSIS — F320 Major depressive disorder, single episode, mild: Secondary | ICD-10-CM | POA: Diagnosis not present

## 2023-01-24 DIAGNOSIS — E7800 Pure hypercholesterolemia, unspecified: Secondary | ICD-10-CM

## 2023-01-24 DIAGNOSIS — D508 Other iron deficiency anemias: Secondary | ICD-10-CM

## 2023-01-24 MED ORDER — FLUOXETINE HCL 20 MG PO CAPS
80.0000 mg | ORAL_CAPSULE | Freq: Every day | ORAL | 2 refills | Status: DC
Start: 2023-01-24 — End: 2023-06-05

## 2023-01-24 NOTE — Progress Notes (Signed)
Subjective:  Patient ID: Jennifer Rosales, female    DOB: 1985-10-11, 37 y.o.   MRN: [id number]  Patient Care Team: Sonny Masters, FNP as PCP - General (Family Medicine) Gerald Leitz, MD (Obstetrics and Gynecology) Danella Maiers, [facility] as Pharmacist (Family Medicine)   Chief Complaint:  Luiz Blare' Disease (3 month follow up.  States she is still having the dizzy spells but not as often. )   HPI: Jennifer Rosales is a 37 y.o. female presenting on 01/24/2023 for Graves' Disease (3 month follow up.  States she is still having the dizzy spells but not as often. )   Discussed the use of AI scribe software for clinical note transcription with the patient, who gave verbal consent to proceed.  History of Present Illness   The patient, with a history of anxiety, depression, and hypothyroidism, presents with concerns about weight gain and increased anxiety. They report a cessation of a weight loss medication, Qsymia, due to side effects of dizziness and tingling. Since discontinuing the medication, the patient has noticed a reduction in these symptoms but has experienced a weight gain of over 15 pounds.  The patient also reports an increase in anxiety, particularly frustration and irritability, despite being on Fluoxetine 60mg  daily. They have been on this dosage for a significant period and have noticed a recent change in their emotional response, particularly towards their children.  In addition to these concerns, the patient continues to experience intermittent headaches and dizziness, although these symptoms have decreased in frequency. They also report ongoing hair loss, which has been a long-term issue.  The patient is also on Synthroid for hypothyroidism, which they have recently switched to taking at night. They are eager to see if this change in administration time has had any impact on their thyroid function. They also take a daily vitamin and a supplement for iron deficiency.  The  patient denies any changes in bowel habits, insomnia, cold intolerance, or changes in skin or hair other than the ongoing hair loss. They have not noticed any significant side effects from their current medications.           01/24/2023    3:37 PM 10/24/2022    3:37 PM 09/20/2022    8:23 AM 07/27/2022   11:35 AM 04/25/2022    3:56 PM  Depression screen PHQ 2/9  Decreased Interest 0 0 0 0 0  Down, Depressed, Hopeless 0 0 0 0 0  PHQ - 2 Score 0 0 0 0 0  Altered sleeping 0 1 2 1  0  Tired, decreased energy 2 2 3 1  0  Change in appetite 2 2 0 1 0  Feeling bad or failure about yourself  1 0 0 1 0  Trouble concentrating 0 0 0 0 0  Moving slowly or fidgety/restless 0 0 0 0 0  Suicidal thoughts 0 0 0 0 0  PHQ-9 Score 5 5 5 4  0  Difficult doing work/chores Somewhat difficult Somewhat difficult Somewhat difficult Not difficult at all Not difficult at all      01/24/2023    3:38 PM 10/24/2022    3:37 PM 09/20/2022    8:24 AM 07/27/2022   11:36 AM  GAD 7 : Generalized Anxiety Score  Nervous, Anxious, on Edge 1 0 0 1  Control/stop worrying 1 0 0 1  Worry too much - different things 1 0 0 1  Trouble relaxing 1 2 2 1   Restless 0 1 2 1  Easily annoyed or irritable 2 0 1 2  Afraid - awful might happen 1 0 0 2  Total GAD 7 Score 7 3 5 9   Anxiety Difficulty Not difficult at all Not difficult at all Somewhat difficult Somewhat difficult       Relevant past medical, surgical, family, and social history reviewed and updated as indicated.  Allergies and medications reviewed and updated. Data reviewed: Chart in Epic.   Past Medical History:  Diagnosis Date   Anemia    Anxiety    Depression, major, single episode, mild (HCC) 08/17/2021   Graves disease    Graves disease    Obesity     Past Surgical History:  Procedure Laterality Date   CHOLECYSTECTOMY     GALLBLADDER SURGERY  2008   LAPAROSCOPIC GASTRIC SLEEVE RESECTION     THYROIDECTOMY  06/01/2020   TONSILLECTOMY     TONSILLECTOMY  AND ADENOIDECTOMY     WISDOM TOOTH EXTRACTION      Social History   Socioeconomic History   Marital status: Married    Spouse name: Allayne Butcher   Number of children: 3   Years of education: Not on file   Highest education level: Associate degree: occupational, Scientist, product/process development, or vocational program  Occupational History   Not on file  Tobacco Use   Smoking status: Never   Smokeless tobacco: Never  Vaping Use   Vaping status: Never Used  Substance and Sexual Activity   Alcohol use: Not Currently   Drug use: Not Currently   Sexual activity: Yes  Other Topics Concern   Not on file  Social History Narrative   ** Merged History Encounter **       Social Determinants of Health   Financial Resource Strain: Low Risk  (01/23/2023)   Overall Financial Resource Strain (CARDIA)    Difficulty of Paying Living Expenses: Not very hard  Food Insecurity: No Food Insecurity (01/23/2023)   Hunger Vital Sign    Worried About Running Out of Food in the Last Year: Never true    Ran Out of Food in the Last Year: Never true  Transportation Needs: No Transportation Needs (01/23/2023)   PRAPARE - Administrator, Civil Service (Medical): No    Lack of Transportation (Non-Medical): No  Physical Activity: Insufficiently Active (01/23/2023)   Exercise Vital Sign    Days of Exercise per Week: 2 days    Minutes of Exercise per Session: 30 min  Stress: No Stress Concern Present (01/23/2023)   [employer] of Occupational Health - Occupational Stress Questionnaire    Feeling of Stress : Only a little  Social Connections: Moderately Integrated (01/23/2023)   Social Connection and Isolation Panel [NHANES]    Frequency of Communication with Friends and Family: More than three times a week    Frequency of Social Gatherings with Friends and Family: More than three times a week    Attends Religious Services: Never    Database administrator or Organizations: Yes    Attends Museum/gallery exhibitions officer: More than 4 times per year    Marital Status: Married  Catering manager Violence: Not At Risk (09/08/2022)   Received from [facility]   HITS    Over the last 12 months how often did your partner physically hurt you?: 1    Over the last 12 months how often did your partner insult you or talk down to you?: 1    Over the last 12 months  how often did your partner threaten you with physical harm?: 1    Over the last 12 months how often did your partner scream or curse at you?: 1    Outpatient Encounter Medications as of 01/24/2023  Medication Sig   FLUoxetine (PROZAC) 20 MG capsule Take 4 capsules (80 mg total) by mouth daily.   SYNTHROID 300 MCG tablet Take 1 tablet (300 mcg total) by mouth daily before breakfast.   [DISCONTINUED] FLUoxetine (PROZAC) 20 MG capsule Take 1 capsule (20 mg total) by mouth daily.   [DISCONTINUED] FLUoxetine (PROZAC) 40 MG capsule Take 1 capsule (40 mg total) by mouth daily.   [DISCONTINUED] IFEREX 150 150 MG capsule Take 150 mg by mouth 2 (two) times daily.   [DISCONTINUED] QSYMIA 7.5-46 MG CP24 Take 1 capsule by mouth daily.   No facility-administered encounter medications on file as of 01/24/2023.    No Known Allergies  Pertinent ROS per HPI, otherwise unremarkable      Objective:  BP 116/84   Pulse 88   Temp 97.7 F (36.5 C) (Temporal)   Ht 5\' 6"  (1.676 m)   Wt 253 lb 3.2 oz (114.9 kg)   LMP 01/01/2023   SpO2 95%   BMI 40.87 kg/m    Wt Readings from Last 3 Encounters:  01/24/23 253 lb 3.2 oz (114.9 kg)  10/24/22 250 lb 3.2 oz (113.5 kg)  10/17/22 246 lb (111.6 kg)    Physical Exam Vitals and nursing note reviewed.  Constitutional:      General: She is not in acute distress.    Appearance: Normal appearance. She is morbidly obese. She is not ill-appearing, toxic-appearing or diaphoretic.  HENT:     Head: Normocephalic and atraumatic.     Nose: Nose normal.     Mouth/Throat:     Mouth: Mucous  membranes are moist.  Eyes:     Conjunctiva/sclera: Conjunctivae normal.     Pupils: Pupils are equal, round, and reactive to light.  Neck:     Comments: Well healed thyroidectomy surgical scar Cardiovascular:     Rate and Rhythm: Normal rate and regular rhythm.     Heart sounds: Normal heart sounds.  Pulmonary:     Effort: Pulmonary effort is normal.     Breath sounds: Normal breath sounds.  Musculoskeletal:     Right lower leg: No edema.     Left lower leg: No edema.  Skin:    General: Skin is warm and dry.     Capillary Refill: Capillary refill takes less than 2 seconds.  Neurological:     General: No focal deficit present.     Mental Status: She is alert and oriented to person, place, and time.     Cranial Nerves: No cranial nerve deficit.     Sensory: No sensory deficit.     Motor: No weakness.     Coordination: Coordination normal.     Gait: Gait normal.     Deep Tendon Reflexes: Reflexes normal.  Psychiatric:        Mood and Affect: Mood normal.        Behavior: Behavior normal.        Thought Content: Thought content normal.        Judgment: Judgment normal.      Results for orders placed or performed in visit on 11/13/22  QuantiFERON-TB Gold Plus  Result Value Ref Range   QuantiFERON Incubation Incubation performed.    QuantiFERON Criteria Comment    QuantiFERON TB1 Ag  Value 0.00 IU/mL   QuantiFERON TB2 Ag Value 0.00 IU/mL   QuantiFERON Nil Value 0.00 IU/mL   QuantiFERON Mitogen Value >10.00 IU/mL   QuantiFERON-TB Gold Plus Negative Negative       Pertinent labs & imaging results that were available during my care of the patient were reviewed by me and considered in my medical decision making.  Assessment & Plan:  Christionna was seen today for graves' disease.  Diagnoses and all orders for this visit:  GAD (generalized anxiety disorder) -     FLUoxetine (PROZAC) 20 MG capsule; Take 4 capsules (80 mg total) by mouth daily. -     Thyroid Panel With  TSH  Graves disease -     Thyroid Panel With TSH -     T4, Free -     T3, Free  S/P total thyroidectomy -     Thyroid Panel With TSH -     T4, Free -     T3, Free  Depression, major, single episode, mild (HCC) -     FLUoxetine (PROZAC) 20 MG capsule; Take 4 capsules (80 mg total) by mouth daily. -     Thyroid Panel With TSH  Vitamin D deficiency -     CMP14+EGFR -     VITAMIN D 25 Hydroxy (Vit-D Deficiency, Fractures)  Iron deficiency anemia secondary to inadequate dietary iron intake -     Anemia Profile B  Pure hypercholesterolemia -     Lipid panel  Morbid (severe) obesity due to excess calories (HCC) -     Bayer DCA Hb A1c Waived     Assessment and Plan    Hypothyroidism Patient reports ongoing hair loss. Currently on Synthroid nightly. Previous labs showed suboptimal control. -Order thyroid panel including free T3 to assess need for T3 repletion. -Consider switching to Synthroid direct from [facility] for cost savings.  Anxiety Increased irritability and frustration noted on current regimen of Fluoxetine 60mg  daily. -Increase Fluoxetine to 80mg  daily (four 20mg  capsules).  Weight Management Patient self-discontinued Qsymia due to side effects (dizziness). Reports weight regain. -Provide information for Wegovy and Zepbound for patient to check insurance coverage. -Check A1C to assess for potential prediabetes or diabetes which may affect insurance coverage for weight management medications.  Iron Deficiency Ongoing, currently on supplementation. -Order iron panel to assess current status.  Vitamin D Deficiency Ongoing, currently on supplementation. -Order Vitamin D level to assess current status.  Hyperlipidemia On treatment, recent diet not ideal for lipid control. -Order lipid panel to assess current status.  Follow-up Pending lab results.          Continue all other maintenance medications.  Follow up plan: Return in about 3  months (around 04/26/2023), or if symptoms worsen or fail to improve, for CPE.   Continue healthy lifestyle choices, including diet (rich in fruits, vegetables, and lean proteins, and low in salt and simple carbohydrates) and exercise (at least 30 minutes of moderate physical activity daily).    The above assessment and management plan was discussed with the patient. The patient verbalized understanding of and has agreed to the management plan. Patient is aware to call the clinic if they develop any new symptoms or if symptoms persist or worsen. Patient is aware when to return to the clinic for a follow-up visit. Patient educated on when it is appropriate to go to the emergency department.   Kari Baars, FNP-C [facility] [phone]

## 2023-01-24 NOTE — Patient Instructions (Signed)
Here is a guide to help [place] find out which weight loss medications will be covered by your insurance plan.  Please check out this web site  [employer] and follow the instructions.    [employer]egovy, Zepbound   There is also a phone number you can call if you do not have access to the Internet. Call [phone] (Monday- Friday 8am-8pm) and an associate will help navigate you.   [employer] provides coverage information for more than 80% of the inquiries submitted!!

## 2023-01-25 LAB — ANEMIA PROFILE B
Basophils Absolute: 0 10*3/uL (ref 0.0–0.2)
Basos: 0 %
EOS (ABSOLUTE): 0.2 10*3/uL (ref 0.0–0.4)
Eos: 2 %
Ferritin: 25 ng/mL (ref 15–150)
Folate: 9 ng/mL (ref >3.0)
Hematocrit: 43.6 % (ref 34.0–46.6)
Hemoglobin: 13.7 g/dL (ref 11.1–15.9)
Immature Grans (Abs): 0 10*3/uL (ref 0.0–0.1)
Immature Granulocytes: 0 %
Iron Saturation: 8 % — CL (ref 15–55)
Iron: 26 ug/dL — ABNORMAL LOW (ref 27–159)
Lymphocytes Absolute: 2.2 10*3/uL (ref 0.7–3.1)
Lymphs: 28 %
MCH: 26 pg — ABNORMAL LOW (ref 26.6–33.0)
MCHC: 31.4 g/dL — ABNORMAL LOW (ref 31.5–35.7)
MCV: 83 fL (ref 79–97)
Monocytes Absolute: 0.6 10*3/uL (ref 0.1–0.9)
Monocytes: 8 %
Neutrophils Absolute: 4.7 10*3/uL (ref 1.4–7.0)
Neutrophils: 62 %
Platelets: 277 10*3/uL (ref 150–450)
RBC: 5.27 x10E6/uL (ref 3.77–5.28)
RDW: 13.3 % (ref 11.7–15.4)
Retic Ct Pct: 1 % (ref 0.6–2.6)
Total Iron Binding Capacity: 332 ug/dL (ref 250–450)
UIBC: 306 ug/dL (ref 131–425)
Vitamin B-12: 333 pg/mL (ref 232–1245)
WBC: 7.6 10*3/uL (ref 3.4–10.8)

## 2023-01-25 LAB — CMP14+EGFR
ALT: 14 [IU]/L (ref 0–32)
AST: 17 [IU]/L (ref 0–40)
Albumin: 4.2 g/dL (ref 3.9–4.9)
Alkaline Phosphatase: 101 [IU]/L (ref 44–121)
BUN/Creatinine Ratio: 23 (ref 9–23)
BUN: 16 mg/dL (ref 6–20)
Bilirubin Total: 0.2 mg/dL (ref 0.0–1.2)
CO2: 23 mmol/L (ref 20–29)
Calcium: 9.3 mg/dL (ref 8.7–10.2)
Chloride: 104 mmol/L (ref 96–106)
Creatinine, Ser: 0.7 mg/dL (ref 0.57–1.00)
Globulin, Total: 2.7 g/dL (ref 1.5–4.5)
Glucose: 89 mg/dL (ref 70–99)
Potassium: 4.2 mmol/L (ref 3.5–5.2)
Sodium: 142 mmol/L (ref 134–144)
Total Protein: 6.9 g/dL (ref 6.0–8.5)
eGFR: 116 mL/min/{1.73_m2} (ref >59)

## 2023-01-25 LAB — T4, FREE: Free T4: 2.08 ng/dL — ABNORMAL HIGH (ref 0.82–1.77)

## 2023-01-25 LAB — LIPID PANEL
Chol/HDL Ratio: 2.9 ratio (ref 0.0–4.4)
Cholesterol, Total: 158 mg/dL (ref 100–199)
HDL: 55 mg/dL (ref >39)
LDL Chol Calc (NIH): 90 mg/dL (ref 0–99)
Triglycerides: 66 mg/dL (ref 0–149)
VLDL Cholesterol Cal: 13 mg/dL (ref 5–40)

## 2023-01-25 LAB — BAYER DCA HB A1C WAIVED: HB A1C (BAYER DCA - WAIVED): 5.1 % (ref 4.8–5.6)

## 2023-01-25 LAB — THYROID PANEL WITH TSH
Free Thyroxine Index: 4.2 (ref 1.2–4.9)
T3 Uptake Ratio: 37 % (ref 24–39)
T4, Total: 11.3 ug/dL (ref 4.5–12.0)
TSH: <0.005 u[IU]/mL — ABNORMAL LOW (ref 0.450–4.500)

## 2023-01-25 LAB — T3, FREE: T3, Free: 3.8 pg/mL (ref 2.0–4.4)

## 2023-01-25 LAB — VITAMIN D 25 HYDROXY (VIT D DEFICIENCY, FRACTURES): Vit D, 25-Hydroxy: 31.3 ng/mL (ref 30.0–100.0)

## 2023-01-25 MED ORDER — IRON (FERROUS SULFATE) 325 (65 FE) MG PO TABS
325.0000 mg | ORAL_TABLET | Freq: Every day | ORAL | 6 refills | Status: AC
Start: 2023-01-25 — End: ?

## 2023-01-25 MED ORDER — ZEPBOUND 7.5 MG/0.5ML ~~LOC~~ SOAJ: 7.5000 mg | SUBCUTANEOUS | 0 refills | Status: AC

## 2023-01-25 MED ORDER — TIRZEPATIDE-WEIGHT MANAGEMENT 2.5 MG/0.5ML ~~LOC~~ SOLN
2.5000 mg | SUBCUTANEOUS | 0 refills | Status: AC
Start: 2023-01-25 — End: 2023-02-16

## 2023-01-25 MED ORDER — TIRZEPATIDE-WEIGHT MANAGEMENT 5 MG/0.5ML ~~LOC~~ SOLN
5.0000 mg | SUBCUTANEOUS | 0 refills | Status: AC
Start: 2023-01-25 — End: 2023-02-16

## 2023-01-25 NOTE — Addendum Note (Signed)
Addended by: Sonny Masters on: 01/25/2023 01:15 PM   Modules accepted: Orders

## 2023-01-25 NOTE — Addendum Note (Signed)
Addended by: Sonny Masters on: 01/25/2023 03:58 PM   Modules accepted: Orders

## 2023-01-25 NOTE — Addendum Note (Signed)
Addended by: Sonny Masters on: 01/25/2023 01:17 PM   Modules accepted: Orders

## 2023-02-13 ENCOUNTER — Encounter: Payer: Self-pay | Admitting: Family Medicine

## 2023-02-14 ENCOUNTER — Encounter: Payer: Self-pay | Admitting: Family Medicine

## 2023-02-15 ENCOUNTER — Other Ambulatory Visit: Payer: Self-pay | Admitting: Family Medicine

## 2023-02-15 DIAGNOSIS — D508 Other iron deficiency anemias: Secondary | ICD-10-CM

## 2023-04-23 ENCOUNTER — Inpatient Hospital Stay: Payer: Medicaid Other | Admitting: Oncology

## 2023-04-23 ENCOUNTER — Inpatient Hospital Stay: Payer: Medicaid Other | Admitting: Hematology

## 2023-04-23 ENCOUNTER — Inpatient Hospital Stay: Payer: Medicaid Other

## 2023-05-01 ENCOUNTER — Other Ambulatory Visit: Payer: Self-pay | Admitting: Family Medicine

## 2023-05-01 DIAGNOSIS — E0500 Thyrotoxicosis with diffuse goiter without thyrotoxic crisis or storm: Secondary | ICD-10-CM

## 2023-05-01 DIAGNOSIS — E890 Postprocedural hypothyroidism: Secondary | ICD-10-CM

## 2023-05-02 LAB — HM COLONOSCOPY

## 2023-05-21 ENCOUNTER — Encounter: Payer: Self-pay | Admitting: Family Medicine

## 2023-05-23 ENCOUNTER — Encounter: Payer: BC Managed Care – PPO | Admitting: Family Medicine

## 2023-06-05 ENCOUNTER — Encounter: Payer: Self-pay | Admitting: Family Medicine

## 2023-06-05 ENCOUNTER — Ambulatory Visit: Payer: Self-pay | Admitting: Family Medicine

## 2023-06-05 VITALS — BP 120/81 | HR 74 | Temp 97.2°F | Ht 66.0 in | Wt 258.8 lb

## 2023-06-05 DIAGNOSIS — D508 Other iron deficiency anemias: Secondary | ICD-10-CM

## 2023-06-05 DIAGNOSIS — E890 Postprocedural hypothyroidism: Secondary | ICD-10-CM

## 2023-06-05 DIAGNOSIS — F320 Major depressive disorder, single episode, mild: Secondary | ICD-10-CM | POA: Diagnosis not present

## 2023-06-05 DIAGNOSIS — E559 Vitamin D deficiency, unspecified: Secondary | ICD-10-CM | POA: Diagnosis not present

## 2023-06-05 DIAGNOSIS — E0500 Thyrotoxicosis with diffuse goiter without thyrotoxic crisis or storm: Secondary | ICD-10-CM

## 2023-06-05 DIAGNOSIS — F411 Generalized anxiety disorder: Secondary | ICD-10-CM

## 2023-06-05 DIAGNOSIS — E7800 Pure hypercholesterolemia, unspecified: Secondary | ICD-10-CM

## 2023-06-05 MED ORDER — FLUOXETINE HCL 20 MG PO CAPS
40.0000 mg | ORAL_CAPSULE | Freq: Every day | ORAL | 2 refills | Status: DC
Start: 2023-06-05 — End: 2024-02-12

## 2023-06-05 NOTE — Progress Notes (Signed)
 Subjective:  Patient ID: Jennifer Rosales, female    DOB: 1985/09/13, 38 y.o.   MRN: [id number]  Patient Care Team: Sonny Masters, FNP as PCP - General (Family Medicine) Gerald Leitz, MD (Obstetrics and Gynecology) Danella Maiers, [facility] as Pharmacist (Family Medicine)   Chief Complaint:  Thyroid Problem (3 month follow up )   HPI: Jennifer Rosales is a 38 y.o. female presenting on 06/05/2023 for Thyroid Problem (3 month follow up )   Discussed the use of AI scribe software for clinical note transcription with the patient, who gave verbal consent to proceed.  History of Present Illness   Jennifer Rosales is a 38 year old female with thyroid dysfunction and anxiety who presents with dizziness and medication management.  She experiences intermittent dizziness, primarily while driving, which sometimes necessitates pulling over and closing her eyes. Dizziness also occurs occasionally at home when standing up too quickly. She has been managing these episodes by consuming small, frequent snacks, including protein shakes, which she resumed two weeks ago. Since then, she has not experienced any dizzy spells.  She is currently taking 300 mcg of Synthroid at night and has not taken fluoxetine for at least a month due to forgetfulness. She notes an increase in anxiety and difficulty sleeping since discontinuing fluoxetine. Previously, she was on 80 mg daily, but she is considering restarting at a lower dose due to past dizziness associated with the higher dose.  She has a history of low iron levels and received iron infusions in the past, which improved her symptoms. She continues to take an iron supplement daily, although she feels her iron levels might still be low despite normal lab results from January.  She takes 2000 IU of vitamin D daily and has recently started a new vitamin regimen, including a B complex, after previously using Flintstone vitamins. She takes her iron supplement in the morning  and her new vitamins at lunch.         06/05/2023   11:33 AM 01/24/2023    3:37 PM 10/24/2022    3:37 PM 09/20/2022    8:23 AM 07/27/2022   11:35 AM  Depression screen PHQ 2/9  Decreased Interest 0 0 0 0 0  Down, Depressed, Hopeless 1 0 0 0 0  PHQ - 2 Score 1 0 0 0 0  Altered sleeping 1 0 1 2 1   Tired, decreased energy 1 2 2 3 1   Change in appetite 1 2 2  0 1  Feeling bad or failure about yourself  0 1 0 0 1  Trouble concentrating 0 0 0 0 0  Moving slowly or fidgety/restless 0 0 0 0 0  Suicidal thoughts 0 0 0 0 0  PHQ-9 Score 4 5 5 5 4   Difficult doing work/chores Not difficult at all Somewhat difficult Somewhat difficult Somewhat difficult Not difficult at all      06/05/2023   11:33 AM 01/24/2023    3:38 PM 10/24/2022    3:37 PM 09/20/2022    8:24 AM  GAD 7 : Generalized Anxiety Score  Nervous, Anxious, on Edge 1 1 0 0  Control/stop worrying 1 1 0 0  Worry too much - different things 1 1 0 0  Trouble relaxing 0 1 2 2   Restless 0 0 1 2  Easily annoyed or irritable 1 2 0 1  Afraid - awful might happen 1 1 0 0  Total GAD 7 Score 5 7 3  5  Anxiety Difficulty Not difficult at all Not difficult at all Not difficult at all Somewhat difficult        Relevant past medical, surgical, family, and social history reviewed and updated as indicated.  Allergies and medications reviewed and updated. Data reviewed: Chart in Epic.   Past Medical History:  Diagnosis Date   Anemia    Anxiety    Depression, major, single episode, mild (HCC) 08/17/2021   Graves disease    Graves disease    Obesity     Past Surgical History:  Procedure Laterality Date   CHOLECYSTECTOMY     GALLBLADDER SURGERY  2008   LAPAROSCOPIC GASTRIC SLEEVE RESECTION     THYROIDECTOMY  06/01/2020   TONSILLECTOMY     TONSILLECTOMY AND ADENOIDECTOMY     WISDOM TOOTH EXTRACTION      Social History   Socioeconomic History   Marital status: Married    Spouse name: Allayne Butcher   Number of children: 3   Years  of education: Not on file   Highest education level: Associate degree: occupational, Scientist, product/process development, or vocational program  Occupational History   Not on file  Tobacco Use   Smoking status: Never   Smokeless tobacco: Never  Vaping Use   Vaping status: Never Used  Substance and Sexual Activity   Alcohol use: Not Currently   Drug use: Not Currently   Sexual activity: Yes  Other Topics Concern   Not on file  Social History Narrative   ** Merged History Encounter **       Social Drivers of Health   Financial Resource Strain: Low Risk  (01/23/2023)   Overall Financial Resource Strain (CARDIA)    Difficulty of Paying Living Expenses: Not very hard  Food Insecurity: No Food Insecurity (01/23/2023)   Hunger Vital Sign    Worried About Running Out of Food in the Last Year: Never true    Ran Out of Food in the Last Year: Never true  Transportation Needs: No Transportation Needs (01/23/2023)   PRAPARE - Administrator, Civil Service (Medical): No    Lack of Transportation (Non-Medical): No  Physical Activity: Insufficiently Active (01/23/2023)   Exercise Vital Sign    Days of Exercise per Week: 2 days    Minutes of Exercise per Session: 30 min  Stress: No Stress Concern Present (01/23/2023)   [employer] of Occupational Health - Occupational Stress Questionnaire    Feeling of Stress : Only a little  Social Connections: Moderately Integrated (01/23/2023)   Social Connection and Isolation Panel [NHANES]    Frequency of Communication with Friends and Family: More than three times a week    Frequency of Social Gatherings with Friends and Family: More than three times a week    Attends Religious Services: Never    Database administrator or Organizations: Yes    Attends Engineer, structural: More than 4 times per year    Marital Status: Married  Catering manager Violence: Not At Risk (09/08/2022)   Received from [employer], [facility]   HITS    Over the last  12 months how often did your partner physically hurt you?: Never    Over the last 12 months how often did your partner insult you or talk down to you?: Never    Over the last 12 months how often did your partner threaten you with physical harm?: Never    Over the last 12 months how often did your partner scream  or curse at you?: Never    Outpatient Encounter Medications as of 06/05/2023  Medication Sig   Iron, Ferrous Sulfate, 325 (65 Fe) MG TABS Take 325 mg by mouth daily.   SYNTHROID 300 MCG tablet TAKE 1 TABLET BY MOUTH ONCE DAILY BEFORE BREAKFAST   [DISCONTINUED] FLUoxetine (PROZAC) 20 MG capsule Take 4 capsules (80 mg total) by mouth daily.   FLUoxetine (PROZAC) 20 MG capsule Take 2 capsules (40 mg total) by mouth daily.   No facility-administered encounter medications on file as of 06/05/2023.    Allergies  Allergen Reactions   Ferric Carboxymaltose Rash    Pertinent ROS per HPI, otherwise unremarkable      Objective:  BP 120/81   Pulse 74   Temp (!) 97.2 F (36.2 C)   Ht 5\' 6"  (1.676 m)   Wt 258 lb 12.8 oz (117.4 kg)   LMP 05/16/2023   SpO2 96%   BMI 41.77 kg/m    Wt Readings from Last 3 Encounters:  06/05/23 258 lb 12.8 oz (117.4 kg)  01/24/23 253 lb 3.2 oz (114.9 kg)  10/24/22 250 lb 3.2 oz (113.5 kg)    Physical Exam Vitals and nursing note reviewed.  Constitutional:      Appearance: Normal appearance. She is well-developed and well-groomed. She is morbidly obese.  HENT:     Head: Normocephalic and atraumatic.     Mouth/Throat:     Mouth: Mucous membranes are moist.  Eyes:     Conjunctiva/sclera: Conjunctivae normal.     Pupils: Pupils are equal, round, and reactive to light.  Cardiovascular:     Rate and Rhythm: Normal rate and regular rhythm.     Heart sounds: Normal heart sounds.  Pulmonary:     Effort: Pulmonary effort is normal.     Breath sounds: Normal breath sounds.  Musculoskeletal:     Cervical back: Neck supple.     Right lower leg: No  edema.     Left lower leg: No edema.  Skin:    General: Skin is warm and dry.     Capillary Refill: Capillary refill takes less than 2 seconds.  Neurological:     General: No focal deficit present.     Mental Status: She is alert and oriented to person, place, and time.  Psychiatric:        Mood and Affect: Mood normal.        Behavior: Behavior normal. Behavior is cooperative.        Thought Content: Thought content normal.        Judgment: Judgment normal.      Results for orders placed or performed in visit on 01/24/23  Bayer DCA Hb A1c Waived   Collection Time: 01/24/23  3:51 PM  Result Value Ref Range   HB A1C (BAYER DCA - WAIVED) 5.1 4.8 - 5.6 %  CMP14+EGFR   Collection Time: 01/24/23  3:52 PM  Result Value Ref Range   Glucose 89 70 - 99 mg/dL   BUN 16 6 - 20 mg/dL   Creatinine, Ser 4.09 0.57 - 1.00 mg/dL   eGFR 811 >91 YN/WGN/5.62   BUN/Creatinine Ratio 23 9 - 23   Sodium 142 134 - 144 mmol/L   Potassium 4.2 3.5 - 5.2 mmol/L   Chloride 104 96 - 106 mmol/L   CO2 23 20 - 29 mmol/L   Calcium 9.3 8.7 - 10.2 mg/dL   Total Protein 6.9 6.0 - 8.5 g/dL   Albumin 4.2 3.9 - 4.9  g/dL   Globulin, Total 2.7 1.5 - 4.5 g/dL   Bilirubin Total 0.2 0.0 - 1.2 mg/dL   Alkaline Phosphatase 101 44 - 121 IU/L   AST 17 0 - 40 IU/L   ALT 14 0 - 32 IU/L  Lipid panel   Collection Time: 01/24/23  3:52 PM  Result Value Ref Range   Cholesterol, Total 158 100 - 199 mg/dL   Triglycerides 66 0 - 149 mg/dL   HDL 55 >16 mg/dL   VLDL Cholesterol Cal 13 5 - 40 mg/dL   LDL Chol Calc (NIH) 90 0 - 99 mg/dL   Chol/HDL Ratio 2.9 0.0 - 4.4 ratio  Thyroid Panel With TSH   Collection Time: 01/24/23  3:52 PM  Result Value Ref Range   TSH <0.005 (L) 0.450 - 4.500 uIU/mL   T4, Total 11.3 4.5 - 12.0 ug/dL   T3 Uptake Ratio 37 24 - 39 %   Free Thyroxine Index 4.2 1.2 - 4.9  VITAMIN D 25 Hydroxy (Vit-D Deficiency, Fractures)   Collection Time: 01/24/23  3:52 PM  Result Value Ref Range   Vit D,  25-Hydroxy 31.3 30.0 - 100.0 ng/mL  Anemia Profile B   Collection Time: 01/24/23  3:52 PM  Result Value Ref Range   Total Iron Binding Capacity 332 250 - 450 ug/dL   UIBC 109 604 - 540 ug/dL   Iron 26 (L) 27 - 981 ug/dL   Iron Saturation 8 (LL) 15 - 55 %   Ferritin 25 15 - 150 ng/mL   Vitamin B-12 333 232 - 1,245 pg/mL   Folate 9.0 >3.0 ng/mL   WBC 7.6 3.4 - 10.8 x10E3/uL   RBC 5.27 3.77 - 5.28 x10E6/uL   Hemoglobin 13.7 11.1 - 15.9 g/dL   Hematocrit 19.1 47.8 - 46.6 %   MCV 83 79 - 97 fL   MCH 26.0 (L) 26.6 - 33.0 pg   MCHC 31.4 (L) 31.5 - 35.7 g/dL   RDW 29.5 62.1 - 30.8 %   Platelets 277 150 - 450 x10E3/uL   Neutrophils 62 Not Estab. %   Lymphs 28 Not Estab. %   Monocytes 8 Not Estab. %   Eos 2 Not Estab. %   Basos 0 Not Estab. %   Neutrophils Absolute 4.7 1.4 - 7.0 x10E3/uL   Lymphocytes Absolute 2.2 0.7 - 3.1 x10E3/uL   Monocytes Absolute 0.6 0.1 - 0.9 x10E3/uL   EOS (ABSOLUTE) 0.2 0.0 - 0.4 x10E3/uL   Basophils Absolute 0.0 0.0 - 0.2 x10E3/uL   Immature Granulocytes 0 Not Estab. %   Immature Grans (Abs) 0.0 0.0 - 0.1 x10E3/uL   Retic Ct Pct 1.0 0.6 - 2.6 %  T4, Free   Collection Time: 01/24/23  3:52 PM  Result Value Ref Range   Free T4 2.08 (H) 0.82 - 1.77 ng/dL  T3, Free   Collection Time: 01/24/23  3:52 PM  Result Value Ref Range   T3, Free 3.8 2.0 - 4.4 pg/mL       Pertinent labs & imaging results that were available during my care of the patient were reviewed by me and considered in my medical decision making.  Assessment & Plan:  Shaneque was seen today for thyroid problem.  Diagnoses and all orders for this visit:  Iron deficiency anemia secondary to inadequate dietary iron intake -     Thyroid Panel With TSH  Vitamin D deficiency -     Thyroid Panel With TSH  GAD (generalized anxiety disorder) -  Thyroid Panel With TSH -     FLUoxetine (PROZAC) 20 MG capsule; Take 2 capsules (40 mg total) by mouth daily. -     T3, Free  Depression, major,  single episode, mild (HCC) -     Thyroid Panel With TSH -     FLUoxetine (PROZAC) 20 MG capsule; Take 2 capsules (40 mg total) by mouth daily. -     T3, Free  Graves disease -     Thyroid Panel With TSH -     T3, Free  S/P total thyroidectomy -     Thyroid Panel With TSH -     T3, Free  Morbid (severe) obesity due to excess calories (HCC) -     Thyroid Panel With TSH -     T3, Free -     Lipid panel  Pure hypercholesterolemia -     Lipid panel     Assessment and Plan    Dizziness Intermittent dizziness, particularly while driving and occasionally when standing up quickly. Improved with increased protein intake through shakes. Discontinuation of fluoxetine may have contributed to dizziness due to potential serotonin syndrome from inconsistent use. - Encourage small frequent protein-rich snacks - Restart fluoxetine at 40 mg, with gradual increase if needed - Monitor dizziness symptoms and report in 4-5 weeks  Anxiety Increased anxiety and difficulty sleeping after discontinuing fluoxetine. Considering restarting fluoxetine at a lower dose due to previous dizziness associated with higher doses. Taking vitamins and supplements that may aid in anxiety management. - Restart fluoxetine at 40 mg, with gradual increase if needed - Monitor anxiety symptoms and report in 4-5 weeks  Thyroid Dysfunction Low TSH levels, but T4 within normal range. Reports no symptoms of hyperactivity. Plan to recheck thyroid function today, including free T3, free T4, and TSH, to ensure proper management of thyroid levels. - Order free T3, free T4, and TSH tests  Iron Deficiency Low iron levels managed with daily iron supplements. Recent labs showed normal iron levels, but she feels that iron levels may still be off. - Continue daily iron supplementation  General Health Maintenance Taking vitamin D and B complex vitamins, including a Flintstone vitamin. Advised to use blue blocker glasses to reduce eye  strain from computer use. - Continue vitamin D 2000 IU daily - Continue B complex vitamins - Recommend blue blocker glasses for computer use          Continue all other maintenance medications.  Follow up plan: Return in about 8 weeks (around 07/31/2023), or if symptoms worsen or fail to improve, for GAD, Depression.   Continue healthy lifestyle choices, including diet (rich in fruits, vegetables, and lean proteins, and low in salt and simple carbohydrates) and exercise (at least 30 minutes of moderate physical activity daily).    The above assessment and management plan was discussed with the patient. The patient verbalized understanding of and has agreed to the management plan. Patient is aware to call the clinic if they develop any new symptoms or if symptoms persist or worsen. Patient is aware when to return to the clinic for a follow-up visit. Patient educated on when it is appropriate to go to the emergency department.   Kari Baars, FNP-C [facility] [phone]

## 2023-06-06 LAB — LIPID PANEL
Chol/HDL Ratio: 3.3 ratio (ref 0.0–4.4)
Cholesterol, Total: 170 mg/dL (ref 100–199)
HDL: 52 mg/dL (ref >39)
LDL Chol Calc (NIH): 103 mg/dL — ABNORMAL HIGH (ref 0–99)
Triglycerides: 81 mg/dL (ref 0–149)
VLDL Cholesterol Cal: 15 mg/dL (ref 5–40)

## 2023-06-06 LAB — THYROID PANEL WITH TSH
Free Thyroxine Index: 5.1 — ABNORMAL HIGH (ref 1.2–4.9)
T3 Uptake Ratio: 36 % (ref 24–39)
T4, Total: 14.2 ug/dL — ABNORMAL HIGH (ref 4.5–12.0)
TSH: <0.005 u[IU]/mL — ABNORMAL LOW (ref 0.450–4.500)

## 2023-06-06 LAB — T3, FREE: T3, Free: 4.4 pg/mL (ref 2.0–4.4)

## 2023-08-03 ENCOUNTER — Other Ambulatory Visit: Payer: Self-pay | Admitting: Family Medicine

## 2023-08-03 DIAGNOSIS — Z9089 Acquired absence of other organs: Secondary | ICD-10-CM

## 2023-08-03 DIAGNOSIS — E0500 Thyrotoxicosis with diffuse goiter without thyrotoxic crisis or storm: Secondary | ICD-10-CM

## 2023-08-10 ENCOUNTER — Encounter: Payer: Self-pay | Admitting: Family Medicine

## 2023-08-10 DIAGNOSIS — R42 Dizziness and giddiness: Secondary | ICD-10-CM

## 2023-08-23 ENCOUNTER — Encounter: Payer: Self-pay | Admitting: Family Medicine

## 2023-08-28 ENCOUNTER — Encounter: Payer: Self-pay | Admitting: Family Medicine

## 2023-08-30 ENCOUNTER — Ambulatory Visit: Admitting: Family Medicine

## 2023-10-10 ENCOUNTER — Encounter: Payer: Self-pay | Admitting: Family Medicine

## 2023-10-25 ENCOUNTER — Encounter: Payer: Self-pay | Admitting: Neurology

## 2024-01-02 ENCOUNTER — Ambulatory Visit: Admitting: Neurology

## 2024-01-02 ENCOUNTER — Other Ambulatory Visit

## 2024-01-02 ENCOUNTER — Encounter: Payer: Self-pay | Admitting: Neurology

## 2024-01-02 VITALS — Ht 68.0 in | Wt 270.0 lb

## 2024-01-02 DIAGNOSIS — G629 Polyneuropathy, unspecified: Secondary | ICD-10-CM

## 2024-01-02 DIAGNOSIS — R42 Dizziness and giddiness: Secondary | ICD-10-CM

## 2024-01-02 DIAGNOSIS — I951 Orthostatic hypotension: Secondary | ICD-10-CM | POA: Diagnosis not present

## 2024-01-02 NOTE — Patient Instructions (Addendum)
 Good to meet you.  Have bloodwork done for thyroid  panel with TSH, vitamin B12, vitamin D , ESR, CRP, SPEP, IFE, folate, ANA  Schedule MRI brain with and without contrast at [facility] imaging [phone]  3. Referral will be sent for Vestibular therapy  4. Please start monitoring BP at home, continue with increased hydration, liberalize salt intake, start using compression stockings  5. Please have a hearing evaluation done  6. Follow-up in 4 months, call for any changes

## 2024-01-02 NOTE — Progress Notes (Signed)
 NEUROLOGY CONSULTATION NOTE  [name] MRN: [id number] DOB: [birth year]  Referring provider: Rock Bruns, FNP Primary care provider: Rock Bruns, FNP  Reason for consult:  dizziness   Thank you for your kind referral of Jennifer Rosales for consultation of the above symptoms. Although her history is well known to you, please allow me to reiterate it for the purpose of our medical record. She is alone in the office today. Records and images were personally reviewed where available.   HISTORY OF PRESENT ILLNESS: This is a 38 year old right-handed woman with a history of Graves disease, OSA, gastric sleeve surgery, depression, anxiety, presenting for evaluation of dizziness. Symptoms started over the past year, she would have episodes occurring multiple times a week where she feels lightheaded. It starts with feeling like she will have an anxiety attack, then starts feeling lightheaded and queasy. She has to hold on for a few minutes, close her eyes, and regroup. It has happened when going from sitting to standing, but she has had it while driving moving her head side to side, she has had to pull over and close her eyes. If she stays still and keeps her head straight, she feels fine. When she is in the grocery, she has to hold on but feels fine looking straight. Her legs feel like she is swaying but she is not moving (sea legs). She had a syncopal episode in July while her son was in the hospital, she stood up and felt like she would pass out then lost consciousness.  She has episodes where it feels like inside her body is fixing to have an anxiety attack, it feels like bubbled gas, with pain in her chest and behind her shoulder. She also has leg cramps. These occur at least 3 night a week. She has tingling in the fingers of both hands and feels like her arms and legs are asleep. She has had swelling in her legs and ankles a few times in the summer. She has night sweats, and also feels hot  and sweaty while at work, separate from the dizziness. Over the past year, she has had headaches at least 1-2 times a week with throbbing over the left temporal region. No nausea/vomiting, she is sensitive to lights. Tylenol  usually helps. Last month was the worst, it lasted 2 days with not much response to OTC medication. Headaches are independent of the dizziness. Her paternal grandmother had migraines. No diplopia, she has right eye floaters. No dysarthria/dysphagia, neck/back pain, bowel/bladder dysfunction. She always feels tired. She reports a 20-lb weight gain since March despite no change in diet. She has intermittent low-pitched tinnitus, she feels she does not hear everything. She works in an office at school. Her brother had a brain tumor. Paternal grandmother had neuropathy.   Last bloodwork on EP[facility] for thyroid  panel (06/2023: TSH <0.005, T4 14.2. She states she has not had medication adjustment.    PAST MEDICAL HISTORY: Past Medical History:  Diagnosis Date   Anemia    Anxiety    Depression, major, single episode, mild 08/17/2021   GERD (gastroesophageal reflux disease)    Graves disease    Graves disease    Obesity    Sleep apnea     PAST SURGICAL HISTORY: Past Surgical History:  Procedure Laterality Date   CHOLECYSTECTOMY     GALLBLADDER SURGERY  2008   LAPAROSCOPIC GASTRIC SLEEVE RESECTION     THYROIDECTOMY  06/01/2020   TONSILLECTOMY     TONSILLECTOMY  AND ADENOIDECTOMY     WISDOM TOOTH EXTRACTION      MEDICATIONS: Current Outpatient Medications on File Prior to Visit  Medication Sig Dispense Refill   Cholecalciferol (VITAMIN D -3 PO) Take by mouth.     FLUoxetine  (PROZAC ) 20 MG capsule Take 2 capsules (40 mg total) by mouth daily. 360 capsule 2   Iron , Ferrous Sulfate , 325 (65 Fe) MG TABS Take 325 mg by mouth daily. 30 tablet 6   Multiple Vitamin (MULTIVITAMIN) tablet Take 1 tablet by mouth daily.     SYNTHROID  300 MCG tablet TAKE 1 TABLET BY MOUTH ONCE DAILY  BEFORE BREAKFAST 30 tablet 0   No current facility-administered medications on file prior to visit.    ALLERGIES: Allergies  Allergen Reactions   Ferric Carboxymaltose Rash    FAMILY HISTORY: Family History  Problem Relation Age of Onset   Healthy Mother    Hypertension Father    Obesity Father    Stroke Father    Breast cancer Maternal Grandmother    Diabetes Maternal Grandmother    Stroke Maternal Grandmother    Breast cancer Paternal Grandmother    Diabetes Paternal Grandmother    Multiple sclerosis Paternal Grandmother    Obesity Paternal Grandmother    Varicose Veins Paternal Grandmother    Crohn's disease Paternal Grandfather    Cancer Brother        brain tumor - child   ADD / ADHD Son     SOCIAL HISTORY: Social History   Socioeconomic History   Marital status: Married    Spouse name: Theadore GLENWOOD Beer   Number of children: 3   Years of education: Not on file   Highest education level: Associate degree: occupational, Scientist, product/process development, or vocational program  Occupational History   Not on file  Tobacco Use   Smoking status: Never   Smokeless tobacco: Never  Vaping Use   Vaping status: Never Used  Substance and Sexual Activity   Alcohol use: Not Currently   Drug use: Not Currently   Sexual activity: Yes  Other Topics Concern   Not on file  Social History Narrative   Living w husband, 3 kids--2 story home 15 steps.   Right hand    Caffeine soda 2 a week.   Social Drivers of Corporate investment banker Strain: Low Risk  (07/23/2023)   Received from [facility]   Overall Financial Resource Strain (CARDIA)    Difficulty of Paying Living Expenses: Not hard at all  Food Insecurity: No Food Insecurity (11/23/2023)   Received from [facility]   Hunger Vital Sign    Within the past 12 months, you worried that your food would run out before you got the money to buy more.: Never true    Within the past 12 months, the food you bought just didn't last and you  didn't have money to get more.: Never true  Transportation Needs: No Transportation Needs (11/23/2023)   Received from [facility] - Transportation    Lack of Transportation (Medical): No    Lack of Transportation (Non-Medical): No  Physical Activity: Insufficiently Active (07/23/2023)   Received from [facility]   Exercise Vital Sign    On average, how many days per week do you engage in moderate to strenuous exercise (like a brisk walk)?: 3 days    On average, how many minutes do you engage in exercise at this level?: 20 min  Stress: No Stress Concern Present (07/23/2023)  Received from [facility] - Occupational Stress Questionnaire    Feeling of Stress : Not at all  Social Connections: Socially Integrated (07/23/2023)   Received from [facility]   Social Network    How would you rate your social network (family, work, friends)?: Good participation with social networks  Intimate Partner Violence: Not At Risk (07/23/2023)   Received from [facility]   HITS    Over the last 12 months how often did your partner physically hurt you?: Never    Over the last 12 months how often did your partner insult you or talk down to you?: Never    Over the last 12 months how often did your partner threaten you with physical harm?: Never    Over the last 12 months how often did your partner scream or curse at you?: Never     PHYSICAL EXAM: Vitals:   01/02/24 1029 01/02/24 1030  BP: 92/60 (!) 94/58  Pulse:    SpO2:     Orthostatic VS for the past 72 hrs (Last 3 readings):  Orthostatic BP Patient Position BP Location Cuff Size Orthostatic Pulse  01/02/24 1030 94/58 Standing Right Arm Large --  01/02/24 1029 92/60 Standing Right Arm Large --  01/02/24 1019 102/86 Supine Right Arm Large 74    General: No acute distress Head:  Normocephalic/atraumatic Skin/Extremities: No rash, no edema Neurological Exam: Mental status: alert and  awake, no dysarthria or aphasia, Fund of knowledge is appropriate. Attention and concentration are normal.   Cranial nerves: CN I: not tested CN II: pupils equal, round, visual fields intact CN III, IV, VI:  full range of motion, no nystagmus, no ptosis CN V: facial sensation intact CN VII: upper and lower face symmetric CN VIII: hearing intact to conversation Bulk & Tone: normal, no fasciculations, no cogwheeling. Motor: 5/5 throughout with no pronator drift. Sensation: intact to light touch, cold, pin on both UE, decreased pin, vibration sense to ankles bilaterally.  No extinction to double simultaneous stimulation.  Romberg test negative Deep Tendon Reflexes: +2 throughout Cerebellar: no incoordination on finger to nose, heel to shin. No dysdiadochokinesia Gait: narrow-based and steady, able to tandem walk adequately. Tremor: none Good finger and foot taps  IMPRESSION: This is a 38 year old right-handed woman with a history of Graves disease, OSA, gastric sleeve surgery, depression, anxiety, presenting for evaluation of dizziness. She describes dizziness as a lightheadedness with some positional component. Neurological exam showed mild neuropathy, neuropathy labs ordered. MRI brain with and without contrast will be ordered to assess for underlying structural abnormality. She is noted to be orthostatic with drop in DBP from 86 to 60 standing to supine. We discussed different causes of dizziness, including orthostatic hypotension. She was advised to start monitoring BP at home, continue hydration, liberalize salt intake, wear compression stockings. With positional component while driving, vestibular therapy will also be ordered for gaze stabilization exercises. Encouraged hearing assessment. FOllow-up in 4 months, call for any changes.   Thank you for allowing me to participate in the care of this patient. Please do not hesitate to call for any questions or concerns.   Darice Shivers,  M.D.  CC: Rock Bruns, FNP

## 2024-01-05 LAB — THYROID PANEL WITH TSH
Free Thyroxine Index: 1.5 (ref 1.4–3.8)
T3 Uptake: 26 % (ref 22–35)
T4, Total: 5.7 ug/dL (ref 5.1–11.9)
TSH: 26.04 m[IU]/L — ABNORMAL HIGH

## 2024-01-05 LAB — FOLATE: Folate: 8 ng/mL

## 2024-01-05 LAB — SEDIMENTATION RATE: Sed Rate: 6 mm/h (ref 0–20)

## 2024-01-05 LAB — C-REACTIVE PROTEIN: CRP: 14.9 mg/L — ABNORMAL HIGH (ref <8.0)

## 2024-01-05 LAB — ANA: Anti Nuclear Antibody (ANA): NEGATIVE

## 2024-01-05 LAB — IMMUNOFIXATION ELECTROPHORESIS
IgG (Immunoglobin G), Serum: 961 mg/dL (ref 600–1640)
IgM, Serum: 173 mg/dL (ref 50–300)
Immunoglobulin A: 188 mg/dL (ref 47–310)

## 2024-01-05 LAB — VITAMIN D 25 HYDROXY (VIT D DEFICIENCY, FRACTURES): Vit D, 25-Hydroxy: 29 ng/mL — ABNORMAL LOW (ref 30–100)

## 2024-01-05 LAB — VITAMIN B12: Vitamin B-12: 253 pg/mL (ref 200–1100)

## 2024-01-08 ENCOUNTER — Encounter: Payer: Self-pay | Admitting: Neurology

## 2024-01-15 ENCOUNTER — Ambulatory Visit
Admission: RE | Admit: 2024-01-15 | Discharge: 2024-01-15 | Disposition: A | Discharge status: Home / Self Care | Source: Ambulatory Visit | Attending: Neurology | Admitting: Neurology

## 2024-01-15 MED ORDER — GADOPICLENOL 0.5 MMOL/ML IV SOLN: 10.0000 mL | Freq: Once | INTRAVENOUS | Status: DC | PRN

## 2024-01-24 ENCOUNTER — Ambulatory Visit: Admitting: Family Medicine

## 2024-01-24 DIAGNOSIS — D508 Other iron deficiency anemias: Secondary | ICD-10-CM

## 2024-01-24 DIAGNOSIS — F411 Generalized anxiety disorder: Secondary | ICD-10-CM

## 2024-01-24 DIAGNOSIS — F320 Major depressive disorder, single episode, mild: Secondary | ICD-10-CM

## 2024-01-24 DIAGNOSIS — E0500 Thyrotoxicosis with diffuse goiter without thyrotoxic crisis or storm: Secondary | ICD-10-CM

## 2024-01-24 DIAGNOSIS — Z9089 Acquired absence of other organs: Secondary | ICD-10-CM

## 2024-01-27 ENCOUNTER — Other Ambulatory Visit: Payer: Self-pay | Admitting: Family Medicine

## 2024-01-27 DIAGNOSIS — E0500 Thyrotoxicosis with diffuse goiter without thyrotoxic crisis or storm: Secondary | ICD-10-CM

## 2024-01-27 DIAGNOSIS — Z9089 Acquired absence of other organs: Secondary | ICD-10-CM

## 2024-02-03 ENCOUNTER — Ambulatory Visit: Payer: Self-pay | Admitting: Neurology

## 2024-02-07 ENCOUNTER — Ambulatory Visit (HOSPITAL_COMMUNITY)

## 2024-02-11 ENCOUNTER — Other Ambulatory Visit: Payer: Self-pay

## 2024-02-11 DIAGNOSIS — F320 Major depressive disorder, single episode, mild: Secondary | ICD-10-CM

## 2024-02-11 DIAGNOSIS — E0500 Thyrotoxicosis with diffuse goiter without thyrotoxic crisis or storm: Secondary | ICD-10-CM

## 2024-02-11 DIAGNOSIS — Z9889 Other specified postprocedural states: Secondary | ICD-10-CM

## 2024-02-11 DIAGNOSIS — F411 Generalized anxiety disorder: Secondary | ICD-10-CM

## 2024-02-11 NOTE — Telephone Encounter (Signed)
 Pt called an informed that brain MRI looked good, no tumor, stroke, or bleed. Her bloodwork showed her thyroid  function is still off. Dr Georjean is not finding a neurological cause for her symptoms, as we discussed, low BP and potentially thyroid  is still the issue, her TSH was elevated 26.04, pls f/u with her endocrinologist and PCP. Also proceed with Vestibular therapy if not yet started

## 2024-02-12 ENCOUNTER — Encounter: Payer: Self-pay | Admitting: Family Medicine

## 2024-02-12 ENCOUNTER — Ambulatory Visit: Payer: Self-pay | Admitting: Family Medicine

## 2024-02-12 ENCOUNTER — Ambulatory Visit: Admitting: Family Medicine

## 2024-02-12 VITALS — BP 100/70 | HR 72 | Temp 97.4°F | Ht 68.0 in | Wt 270.8 lb

## 2024-02-12 DIAGNOSIS — D508 Other iron deficiency anemias: Secondary | ICD-10-CM | POA: Diagnosis not present

## 2024-02-12 DIAGNOSIS — G4733 Obstructive sleep apnea (adult) (pediatric): Secondary | ICD-10-CM | POA: Insufficient documentation

## 2024-02-12 DIAGNOSIS — E559 Vitamin D deficiency, unspecified: Secondary | ICD-10-CM

## 2024-02-12 DIAGNOSIS — Z9889 Other specified postprocedural states: Secondary | ICD-10-CM

## 2024-02-12 DIAGNOSIS — E0500 Thyrotoxicosis with diffuse goiter without thyrotoxic crisis or storm: Secondary | ICD-10-CM

## 2024-02-12 DIAGNOSIS — E7800 Pure hypercholesterolemia, unspecified: Secondary | ICD-10-CM

## 2024-02-12 DIAGNOSIS — Z9089 Acquired absence of other organs: Secondary | ICD-10-CM

## 2024-02-12 LAB — BAYER DCA HB A1C WAIVED: HB A1C (BAYER DCA - WAIVED): 4 % — ABNORMAL LOW (ref 4.8–5.6)

## 2024-02-12 MED ORDER — ZEPBOUND 2.5 MG/0.5ML ~~LOC~~ SOAJ: 2.5000 mg | SUBCUTANEOUS | 0 refills | Status: AC

## 2024-02-12 NOTE — Progress Notes (Signed)
 Subjective:  Patient ID: Jennifer Rosales, female    DOB: 12-08-1985, 38 y.o.   MRN: [id number]  Patient Care Team: Severa Rock HERO, FNP as PCP - General (Family Medicine) Rosalva Sawyer, MD (Obstetrics and Gynecology) Billee Mliss BIRCH, [facility] as Pharmacist (Family Medicine)   Chief Complaint:  Medical Management of Chronic Issues   HPI: Jennifer Rosales is a 38 y.o. female presenting on 02/12/2024 for Medical Management of Chronic Issues   Jennifer Rosales is a 38 year old female with hypothyroidism who presents with concerns about elevated TSH levels and weight gain.  Thyroid  dysfunction and weight gain - History of hypothyroidism with prior thyroid  surgery - On levothyroxine  300 mcg daily for over six months - Recent TSH level of 26 in October, elevated above normal range - T4 levels within normal limits - Weight gain of approximately 20 pounds over the past six to seven months  Mood disturbance and medication intolerance - Discontinued fluoxetine  in January due to dizzy spells attributed to the medication - Occasional feelings of mild restlessness - No current pharmacologic treatment for depression or anxiety  Constipation related to iron  supplementation - Takes iron  supplements - Uses fiber supplementation to manage constipation associated with iron  therapy  Obstructive sleep apnea - Diagnosed with sleep apnea in 2017 - Uses CPAP machine for management - Underwent sleep study prior to bariatric surgery     02/12/2024    9:30 AM 02/12/2024    9:22 AM 06/05/2023   11:33 AM 01/24/2023    3:37 PM 10/24/2022    3:37 PM  Depression screen PHQ 2/9  Decreased Interest 0 0 0 0 0  Down, Depressed, Hopeless 0 0 1 0 0  PHQ - 2 Score 0 0 1 0 0  Altered sleeping 2  1 0 1  Tired, decreased energy 2  1 2 2   Change in appetite 1  1 2 2   Feeling bad or failure about yourself  2  0 1 0  Trouble concentrating 0  0 0 0  Moving slowly or fidgety/restless 0  0 0 0  Suicidal thoughts 0   0 0 0  PHQ-9 Score 7  4  5  5    Difficult doing work/chores Not difficult at all  Not difficult at all Somewhat difficult Somewhat difficult     Data saved with a previous flowsheet row definition      02/12/2024    9:31 AM 06/05/2023   11:33 AM 01/24/2023    3:38 PM 10/24/2022    3:37 PM  GAD 7 : Generalized Anxiety Score  Nervous, Anxious, on Edge 0 1 1 0  Control/stop worrying 1 1 1  0  Worry too much - different things 0 1 1 0  Trouble relaxing 0 0 1 2  Restless 0 0 0 1  Easily annoyed or irritable 1 1 2  0  Afraid - awful might happen 1 1 1  0  Total GAD 7 Score 3 5 7 3   Anxiety Difficulty Not difficult at all Not difficult at all Not difficult at all Not difficult at all          Relevant past medical, surgical, family, and social history reviewed and updated as indicated.  Allergies and medications reviewed and updated. Data reviewed: Chart in Epic.   Past Medical History:  Diagnosis Date   Anemia    Anxiety    Depression, major, single episode, mild 08/17/2021   GERD (gastroesophageal reflux disease)  Graves disease    Graves disease    Obesity    Sleep apnea     Past Surgical History:  Procedure Laterality Date   CHOLECYSTECTOMY     GALLBLADDER SURGERY  2008   LAPAROSCOPIC GASTRIC SLEEVE RESECTION     THYROIDECTOMY  06/01/2020   TONSILLECTOMY     TONSILLECTOMY AND ADENOIDECTOMY     WISDOM TOOTH EXTRACTION      Social History   Socioeconomic History   Marital status: Married    Spouse name: Theadore GLENWOOD Beer   Number of children: 3   Years of education: Not on file   Highest education level: Associate degree: academic program  Occupational History   Not on file  Tobacco Use   Smoking status: Never   Smokeless tobacco: Never  Vaping Use   Vaping status: Never Used  Substance and Sexual Activity   Alcohol use: Not Currently   Drug use: Not Currently   Sexual activity: Yes  Other Topics Concern   Not on file  Social History Narrative   Living  w husband, 3 kids--2 story home 15 steps.   Right hand    Caffeine soda 2 a week.   Social Drivers of Corporate Investment Banker Strain: Low Risk  (02/11/2024)   Overall Financial Resource Strain (CARDIA)    Difficulty of Paying Living Expenses: Not very hard  Food Insecurity: No Food Insecurity (02/11/2024)   Hunger Vital Sign    Worried About Running Out of Food in the Last Year: Never true    Ran Out of Food in the Last Year: Never true  Transportation Needs: No Transportation Needs (02/11/2024)   PRAPARE - Administrator, Civil Service (Medical): No    Lack of Transportation (Non-Medical): No  Physical Activity: Insufficiently Active (02/11/2024)   Exercise Vital Sign    Days of Exercise per Week: 4 days    Minutes of Exercise per Session: 30 min  Stress: Stress Concern Present (02/11/2024)   [employer] [facility] - Occupational Stress Questionnaire    Feeling of Stress: To some extent  Social Connections: Socially Integrated (02/11/2024)   Social Connection and Isolation Panel    Frequency of Communication with Friends and Family: More than three times a week    Frequency of Social Gatherings with Friends and Family: More than three times a week    Attends Religious Services: More than 4 times per year    Active Member of [employer] or Organizations: Yes    Attends Engineer, Structural: More than 4 times per year    Marital Status: Married  Catering Manager Violence: Not At Risk (07/23/2023)   Received from [facility]   HITS    Over the last 12 months how often did your partner physically hurt you?: Never    Over the last 12 months how often did your partner insult you or talk down to you?: Never    Over the last 12 months how often did your partner threaten you with physical harm?: Never    Over the last 12 months how often did your partner scream or curse at you?: Never    Outpatient Encounter Medications as of 02/12/2024   Medication Sig   calcitRIOL  (ROCALTROL ) 0.5 MCG capsule Take 0.5 mcg by mouth daily.   Cholecalciferol (VITAMIN D -3 PO) Take by mouth.   Iron , Ferrous Sulfate , 325 (65 Fe) MG TABS Take 325 mg by mouth daily.   Multiple Vitamin (MULTIVITAMIN) tablet  Take 1 tablet by mouth daily.   SYNTHROID  300 MCG tablet TAKE 1 TABLET BY MOUTH ONCE DAILY BEFORE BREAKFAST   tirzepatide  (ZEPBOUND ) 2.5 MG/0.5ML Pen Inject 2.5 mg into the skin once a week for 4 doses.   [DISCONTINUED] FLUoxetine  (PROZAC ) 20 MG capsule Take 2 capsules (40 mg total) by mouth daily. (Patient not taking: Reported on 02/12/2024)   No facility-administered encounter medications on file as of 02/12/2024.    Allergies  Allergen Reactions   Ferric Carboxymaltose Rash    Pertinent ROS per HPI, otherwise unremarkable      Objective:  BP 100/70   Pulse 72   Temp (!) 97.4 F (36.3 C)   Ht 5' 8 (1.727 m)   Wt 270 lb 12.8 oz (122.8 kg)   SpO2 98%   BMI 41.17 kg/m    Wt Readings from Last 3 Encounters:  02/12/24 270 lb 12.8 oz (122.8 kg)  01/02/24 270 lb (122.5 kg)  06/05/23 258 lb 12.8 oz (117.4 kg)    Physical Exam Vitals and nursing note reviewed.  Constitutional:      General: She is not in acute distress.    Appearance: Normal appearance. She is well-developed and well-groomed. She is morbidly obese. She is not ill-appearing, toxic-appearing or diaphoretic.  HENT:     Head: Normocephalic and atraumatic.     Nose: Nose normal.     Mouth/Throat:     Mouth: Mucous membranes are moist.  Eyes:     Conjunctiva/sclera: Conjunctivae normal.     Pupils: Pupils are equal, round, and reactive to light.  Cardiovascular:     Rate and Rhythm: Normal rate and regular rhythm.     Heart sounds: Normal heart sounds.  Pulmonary:     Effort: Pulmonary effort is normal.     Breath sounds: Normal breath sounds.  Musculoskeletal:     Right lower leg: No edema.     Left lower leg: No edema.  Skin:    General: Skin is warm  and dry.     Capillary Refill: Capillary refill takes less than 2 seconds.  Neurological:     General: No focal deficit present.     Mental Status: She is alert and oriented to person, place, and time.  Psychiatric:        Mood and Affect: Mood normal.        Behavior: Behavior is cooperative.        Thought Content: Thought content normal.        Judgment: Judgment normal.      Results for orders placed or performed in visit on 01/02/24  Thyroid  Panel With TSH   Collection Time: 01/02/24 11:51 AM  Result Value Ref Range   T3 Uptake 26 22 - 35 %   T4, Total 5.7 5.1 - 11.9 mcg/dL   Free Thyroxine Index 1.5 1.4 - 3.8   TSH 26.04 (H) mIU/L  Vitamin B12   Collection Time: 01/02/24 11:51 AM  Result Value Ref Range   Vitamin B-12 253 200 - 1,100 pg/mL  Vitamin D  (25 hydroxy)   Collection Time: 01/02/24 11:51 AM  Result Value Ref Range   Vit D, 25-Hydroxy 29 (L) 30 - 100 ng/mL  C-reactive protein   Collection Time: 01/02/24 11:51 AM  Result Value Ref Range   CRP 14.9 (H) <8.0 mg/L  Sed Rate (ESR)   Collection Time: 01/02/24 11:51 AM  Result Value Ref Range   Sed Rate 6 0 - 20 mm/h  Folate  Collection Time: 01/02/24 11:51 AM  Result Value Ref Range   Folate 8.0 ng/mL  ANA   Collection Time: 01/02/24 11:51 AM  Result Value Ref Range   Anti Nuclear Antibody (ANA) NEGATIVE NEGATIVE  Immunofixation electrophoresis   Collection Time: 01/02/24 11:51 AM  Result Value Ref Range   Immunofix Electr Int     Immunoglobulin A 188 47 - 310 mg/dL   IgG (Immunoglobin G), Serum 961 600 - 1,640 mg/dL   IgM, Serum 826 50 - 699 mg/dL       Pertinent labs & imaging results that were available during my care of the patient were reviewed by me and considered in my medical decision making.  Assessment & Plan:  Anice was seen today for medical management of chronic issues.  Diagnoses and all orders for this visit:  S/P total thyroidectomy -     TSH -     T3, Free -     T4,  Free  Hypocalcemia -     CMP14+EGFR -     VITAMIN D  25 Hydroxy (Vit-D Deficiency, Fractures)  Vitamin D  deficiency -     CMP14+EGFR -     VITAMIN D  25 Hydroxy (Vit-D Deficiency, Fractures)  Graves disease -     TSH -     T3, Free -     T4, Free  Iron  deficiency anemia secondary to inadequate dietary iron  intake -     Anemia Profile B  Morbid (severe) obesity due to excess calories (HCC) -     Anemia Profile B -     CMP14+EGFR -     Lipid panel -     TSH -     VITAMIN D  25 Hydroxy (Vit-D Deficiency, Fractures) -     T3, Free -     T4, Free -     tirzepatide  (ZEPBOUND ) 2.5 MG/0.5ML Pen; Inject 2.5 mg into the skin once a week for 4 doses. -     Bayer DCA Hb A1c Waived  Pure hypercholesterolemia -     CMP14+EGFR -     Lipid panel -     tirzepatide  (ZEPBOUND ) 2.5 MG/0.5ML Pen; Inject 2.5 mg into the skin once a week for 4 doses.  OSA on CPAP -     tirzepatide  (ZEPBOUND ) 2.5 MG/0.5ML Pen; Inject 2.5 mg into the skin once a week for 4 doses.      Hypothyroidism with persistently elevated TSH Persistent elevation of TSH at 26 despite current levothyroxine  dose of 300 mcg. T4 levels are normal. Possible need for T3 supplementation due to elevated TSH and weight gain. - Ordered TSH and T3 levels - Will consider adding Cytomel if T3 levels indicate need  Morbid obesity in the setting of hypothyroidism and obstructive sleep apnea Weight gain of approximately 20 pounds since last visit, possibly related to hypothyroidism. Discussed potential use of Zepbound  for weight management, especially given obstructive sleep apnea diagnosis. Insurance coverage for Zepbound  is uncertain, but sleep apnea diagnosis may facilitate approval. Zepbound  is approved for sleep apnea, which may aid in insurance approval. - Attempted to obtain insurance approval for Zepbound  - If not approved, will consider mail order option - Provided after visit summary with GLP-1 success strategies  Obstructive  sleep apnea, on CPAP therapy Diagnosed in 2017, currently using CPAP machine. Sleep apnea may facilitate insurance approval for Zepbound . - Ensured CPAP usage is documented for insurance purposes  Iron  deficiency anemia Continues to take iron  supplements daily. No reported constipation due to  concurrent fiber intake.  Hypocalcemia and vitamin D  deficiency Continues to use Calcitrol 0.5 daily.  Depression and anxiety, stable off medication Reports feeling stable without fluoxetine , which was discontinued due to dizziness. Occasional anxiety is manageable without medication.          Continue all other maintenance medications.  Follow up plan: Return in 3 months (on 05/14/2024), or if symptoms worsen or fail to improve, for BMI, thyroid  .   Continue healthy lifestyle choices, including diet (rich in fruits, vegetables, and lean proteins, and low in salt and simple carbohydrates) and exercise (at least 30 minutes of moderate physical activity daily).  Educational handout given for hypothyroidism, GLP-1 success  The above assessment and management plan was discussed with the patient. The patient verbalized understanding of and has agreed to the management plan. Patient is aware to call the clinic if they develop any new symptoms or if symptoms persist or worsen. Patient is aware when to return to the clinic for a follow-up visit. Patient educated on when it is appropriate to go to the emergency department.   Rosaline Bruns, FNP-C [facility] [phone]

## 2024-02-12 NOTE — Patient Instructions (Signed)

## 2024-02-13 LAB — ANEMIA PROFILE B
Basophils Absolute: 0 x10E3/uL (ref 0.0–0.2)
Basos: 1 %
EOS (ABSOLUTE): 0.2 x10E3/uL (ref 0.0–0.4)
Eos: 2 %
Ferritin: 107 ng/mL (ref 15–150)
Folate: 5.8 ng/mL (ref >3.0)
Hematocrit: 44.8 % (ref 34.0–46.6)
Hemoglobin: 14.6 g/dL (ref 11.1–15.9)
Immature Grans (Abs): 0 x10E3/uL (ref 0.0–0.1)
Immature Granulocytes: 0 %
Iron Saturation: 19 % (ref 15–55)
Iron: 50 ug/dL (ref 27–159)
Lymphocytes Absolute: 2.1 x10E3/uL (ref 0.7–3.1)
Lymphs: 28 %
MCH: 29.2 pg (ref 26.6–33.0)
MCHC: 32.6 g/dL (ref 31.5–35.7)
MCV: 90 fL (ref 79–97)
Monocytes Absolute: 0.5 x10E3/uL (ref 0.1–0.9)
Monocytes: 7 %
Neutrophils Absolute: 4.7 x10E3/uL (ref 1.4–7.0)
Neutrophils: 62 %
Platelets: 238 x10E3/uL (ref 150–450)
RBC: 5 x10E6/uL (ref 3.77–5.28)
RDW: 13.1 % (ref 11.7–15.4)
Retic Ct Pct: 1.4 % (ref 0.6–2.6)
Total Iron Binding Capacity: 262 ug/dL (ref 250–450)
UIBC: 212 ug/dL (ref 131–425)
Vitamin B-12: 320 pg/mL (ref 232–1245)
WBC: 7.5 x10E3/uL (ref 3.4–10.8)

## 2024-02-13 LAB — CMP14+EGFR
ALT: 12 IU/L (ref 0–32)
AST: 17 IU/L (ref 0–40)
Albumin: 4.2 g/dL (ref 3.9–4.9)
Alkaline Phosphatase: 103 IU/L (ref 41–116)
BUN/Creatinine Ratio: 17 (ref 9–23)
BUN: 16 mg/dL (ref 6–20)
Bilirubin Total: 0.3 mg/dL (ref 0.0–1.2)
CO2: 24 mmol/L (ref 20–29)
Calcium: 8.3 mg/dL — ABNORMAL LOW (ref 8.7–10.2)
Chloride: 102 mmol/L (ref 96–106)
Creatinine, Ser: 0.92 mg/dL (ref 0.57–1.00)
Globulin, Total: 2.2 g/dL (ref 1.5–4.5)
Glucose: 73 mg/dL (ref 70–99)
Potassium: 3.9 mmol/L (ref 3.5–5.2)
Sodium: 140 mmol/L (ref 134–144)
Total Protein: 6.4 g/dL (ref 6.0–8.5)
eGFR: 83 mL/min/1.73 (ref >59)

## 2024-02-13 LAB — TSH: TSH: 20 u[IU]/mL — AB (ref 0.450–4.500)

## 2024-02-13 LAB — LIPID PANEL
Chol/HDL Ratio: 3.5 ratio (ref 0.0–4.4)
Cholesterol, Total: 190 mg/dL (ref 100–199)
HDL: 54 mg/dL (ref >39)
LDL Chol Calc (NIH): 120 mg/dL — ABNORMAL HIGH (ref 0–99)
Triglycerides: 88 mg/dL (ref 0–149)
VLDL Cholesterol Cal: 16 mg/dL (ref 5–40)

## 2024-02-13 LAB — T3, FREE: T3, Free: 2.2 pg/mL (ref 2.0–4.4)

## 2024-02-13 LAB — VITAMIN D 25 HYDROXY (VIT D DEFICIENCY, FRACTURES): Vit D, 25-Hydroxy: 25.6 ng/mL — AB (ref 30.0–100.0)

## 2024-02-13 LAB — T4, FREE: Free T4: 0.99 ng/dL (ref 0.82–1.77)

## 2024-02-14 ENCOUNTER — Other Ambulatory Visit (HOSPITAL_COMMUNITY): Payer: Self-pay

## 2024-02-14 NOTE — Telephone Encounter (Signed)
 Good morning Jennifer Rosales, her prescription card was billed because when we do a test claim the rejection is refill too soon, last filled 02/12/24 next refill is 03/04/24.  Her plan may only allow a discounted price or she may have a high deductible.  She will need to contact member services from the back of her prescription insurance card for the phone number and they can explain to her, her pharmacy benefits concerning zepbound .

## 2024-02-15 NOTE — Therapy (Signed)
 OUTPATIENT PHYSICAL THERAPY VESTIBULAR EVALUATION     Patient Name: Jennifer Rosales MRN: [id number] DOB:January 24, 1986, 38 y.o., female Today's Date: 02/18/2024  END OF SESSION:  PT End of Session - 02/18/24 1113     Visit Number 1    Number of Visits 4    Date for Recertification  03/31/24    Authorization Type [employer]; [employer]    PT Start Time 1115    PT Stop Time 1155    PT Time Calculation (min) 40 min    Activity Tolerance Patient tolerated treatment well    Behavior During Therapy [facility] for tasks assessed/performed          Past Medical History:  Diagnosis Date   Anemia    Anxiety    Depression, major, single episode, mild 08/17/2021   GERD (gastroesophageal reflux disease)    Graves disease    Graves disease    Obesity    Sleep apnea    Past Surgical History:  Procedure Laterality Date   CHOLECYSTECTOMY     GALLBLADDER SURGERY  2008   LAPAROSCOPIC GASTRIC SLEEVE RESECTION     THYROIDECTOMY  06/01/2020   TONSILLECTOMY     TONSILLECTOMY AND ADENOIDECTOMY     WISDOM TOOTH EXTRACTION     Patient Active Problem List   Diagnosis Date Noted   OSA on CPAP 02/12/2024   Varicose veins of bilateral lower extremities with other complications 08/17/2021   GAD (generalized anxiety disorder) 08/17/2021   Depression, major, single episode, mild 08/17/2021   Pure hypercholesterolemia 02/22/2021   Gastroesophageal reflux disease without esophagitis 02/22/2021   S/P total thyroidectomy 07/03/2020   Hypocalcemia 07/03/2020   Graves disease 12/13/2018   Iron  deficiency anemia 06/28/2017   Vitamin D  deficiency 11/03/2015   S/P laparoscopic sleeve gastrectomy 06/09/2015   Morbid (severe) obesity due to excess calories (HCC) 01/04/2015    PCP: Severa Rock HERO, FNP REFERRING PROVIDER: Georjean Darice HERO, MD  REFERRING DIAG: R42 (ICD-10-CM) - Dizziness I95.1 (ICD-10-CM) - Orthostatic hypotension  THERAPY DIAG:  Dizziness and giddiness  ONSET DATE:  2024  Rationale for Evaluation and Treatment: Rehabilitation  SUBJECTIVE:   SUBJECTIVE STATEMENT: Does have some lightheadedness with changes in position.  She feels fuzzy headed with driving and having headaches about 2 x a week; she did pass out in July while at the hospital with her son.  Had an MRI but it appeared normal so referred to therapy.  She also see floaters and has ringing in her ears; states she does have some spinning if she gets up too fast; sometimes with sitting and watching TV; but sometimes just floaters.   Pt accompanied by: self  PERTINENT HISTORY: anemic; Graves Disease   PAIN:  Are you having pain? No  PRECAUTIONS: None  RED FLAGS: None   WEIGHT BEARING RESTRICTIONS: No  FALLS: Has patient fallen in last 6 months? No   PLOF: Independent  PATIENT GOALS: find a solution to why I'm having headaches and feeling dizzy  OBJECTIVE:  Note: Objective measures were completed at Evaluation unless otherwise noted.  DIAGNOSTIC FINDINGS: MRI normal  COGNITION: Overall cognitive status: Within functional limits for tasks assessed   SENSATION: Both fingers and hand go numb  EDEMA:    MUSCLE TONE:  wfl   POSTURE:  rounded shoulders and forward head  Cervical ROM:  wfl grossly throughout  Active AROM (deg) eval  Flexion   Extension   Right lateral flexion   Left lateral flexion   Right rotation  Left rotation   (Blank rows = not tested)  STRENGTH: not tested  LOWER EXTREMITY MMT:   MMT Right eval Left eval  Hip flexion    Hip abduction    Hip adduction    Hip internal rotation    Hip external rotation    Knee flexion    Knee extension    Ankle dorsiflexion    Ankle plantarflexion    Ankle inversion    Ankle eversion    (Blank rows = not tested)  BED MOBILITY:  Sit to supine Modified independence Supine to sit Modified independence Rolling to Right Modified independence Rolling to Left Modified  independence  TRANSFERS: Assistive device utilized: None  Sit to stand: Modified independence Stand to sit: Modified independence Chair to chair: Modified independence Floor: not tested   FUNCTIONAL TESTS:  5 times sit to stand: next visit SLS  PATIENT SURVEYS:  DHI  VESTIBULAR ASSESSMENT:  GENERAL OBSERVATION: see above   SYMPTOM BEHAVIOR:  Subjective history: above  Non-Vestibular symptoms: changes in hearing, headaches, tinnitus, and loss of consciousness  Type of dizziness: Imbalance (Disequilibrium), Spinning/Vertigo, Unsteady with head/body turns, Lightheadedness/Faint, Funny feeling in the head, World moves, and Swimmyheaded  Frequency: daily   Duration: short duration  Aggravating factors: Induced by position change: lying supine, rolling to the right, rolling to the left, supine to sit, and sit to stand, Induced by motion: looking up at the ceiling, turning head quickly, driving, and sitting in a moving car, and Worse outside or in busy environment  Relieving factors: rest  Progression of symptoms: unchanged  OCULOMOTOR EXAM:  Ocular Alignment: normal  Ocular ROM: No Limitations  Spontaneous Nystagmus: absent  Gaze-Induced Nystagmus: absent  Smooth Pursuits: intact  Saccades: intact  Convergence/Divergence: 4 inches   Cover-cross-cover test:   VESTIBULAR - OCULAR REFLEX:   Slow VOR: Positive Bilaterally  VOR Cancellation: Normal  Head-Impulse Test: not tested  Dynamic Visual Acuity: not tested   POSITIONAL TESTING: Right Dix-Hallpike: familiar symptoms  MOTION SENSITIVITY:  Motion Sensitivity Quotient Intensity: 0 = none, 1 = Lightheaded, 2 = Mild, 3 = Moderate, 4 = Severe, 5 = Vomiting  Intensity  1. Sitting to supine 2  2. Supine to L side   3. Supine to R side   4. Supine to sitting   5. L Hallpike-Dix   6. Up from L    7. R Hallpike-Dix 3  8. Up from R  3  9. Sitting, head tipped to L knee   10. Head up from L knee   11. Sitting, head  tipped to R knee   12. Head up from R knee   13. Sitting head turns x5 2  14.Sitting head nods x5 2  15. In stance, 180 turn to L    16. In stance, 180 turn to R     OTHOSTATICS: not done  FUNCTIONAL GAIT:  TREATMENT DATE: 02/18/2024 physical therapy evaluation and HEP instruction   Canalith Repositioning:  Epley Right: Number of Reps: 1 Gaze Adaptation:  x1 Viewing Horizontal: Position: sitting Habituation:   Other:   PATIENT EDUCATION: Education details: Patient educated on exam findings, POC, scope of PT, HEP, and information on BPPV. Person educated: Patient Education method: Explanation, Demonstration, and Handouts Education comprehension: verbalized understanding, returned demonstration, verbal cues required, and tactile cues required  HOME EXERCISE PROGRAM: Access Code: [email or web address] URL: [email or web address] Date: 02/18/2024 Prepared by: AP - Rehab  Exercises - Seated Gaze Stabilization with Head Rotation  - 2 x daily - 7 x weekly - 1 sets - 10 reps  Patient Education - BPPV - What Is BPPV? - BPPV - BPPV - After BPPV Repositioning GOALS: Goals reviewed with patient? No  SHORT TERM GOALS: Target date: 03/17/2024  patient will be independent with initial HEP and compliant with HEP 3-4 times a week   Baseline: Goal status: INITIAL  2.  Patient will report 50% improvement overall  Baseline:  Goal status: INITIAL   LONG TERM GOALS: Target date: 03/31/2024  Patient will be independent in self management strategies to improve quality of life and functional outcomes.  Baseline:  Goal status: INITIAL  2.  Patient will report 80% improvement overall  Baseline:  Goal status: INITIAL  3.  Patient will drive x 30 min to work or in community without any complaint of dizziness Baseline:  Goal status:  INITIAL   ASSESSMENT:  CLINICAL IMPRESSION: Patient is a 38 y.o. female who was seen today for physical therapy evaluation and treatment for R42 (ICD-10-CM) - Dizziness I95.1 (ICD-10-CM) - Orthostatic hypotension.  Patient demonstrates increased vestibular symptoms with provocative testing which is negatively impacting patient ability to perform ADLs and functional mobility tasks. Patient will benefit from skilled physical therapy services to address these deficits to improve level of function with ADLs, functional mobility tasks, and reduce Rosales for falls.    OBJECTIVE IMPAIRMENTS: decreased activity tolerance, dizziness, and impaired perceived functional ability.   ACTIVITY LIMITATIONS: bending, bed mobility, and locomotion level  PARTICIPATION LIMITATIONS: meal prep, cleaning, laundry, driving, shopping, community activity, and occupation   REHAB POTENTIAL: Good  CLINICAL DECISION MAKING: Evolving/moderate complexity  EVALUATION COMPLEXITY: Moderate   PLAN:  PT FREQUENCY: every other week  PT DURATION: 4 weeks  PLANNED INTERVENTIONS: [id number]- PT Re-evaluation, 97110-Therapeutic exercises, 97530- Therapeutic activity, 97112- Neuromuscular re-education, 97535- Self Care, [id number]- Manual therapy, [id number]- Gait training, [phone]- Orthotic Fit/training, [phone]- Canalith repositioning, [id number]- Aquatic Therapy, 97760- Splinting, [id number]- Wound care (first 20 sq cm), 97598- Wound care (each additional 20 sq cm)Patient/Family education, Balance training, Stair training, Taping, Dry Needling, Joint mobilization, Joint manipulation, Spinal manipulation, Spinal mobilization, Scar mobilization, and DME instructions.   PLAN FOR NEXT SESSION: Review HEP and goals; DHI next visit; retest right posterior canal BPPV   11:58 AM, 02/18/24 Efraim Vanallen Small Lashai Grosch MPT [place] [place] [phone] [phone]

## 2024-02-18 ENCOUNTER — Other Ambulatory Visit: Payer: Self-pay

## 2024-02-18 ENCOUNTER — Ambulatory Visit (HOSPITAL_COMMUNITY): Attending: Neurology

## 2024-02-18 DIAGNOSIS — R42 Dizziness and giddiness: Secondary | ICD-10-CM | POA: Insufficient documentation

## 2024-02-18 DIAGNOSIS — I951 Orthostatic hypotension: Secondary | ICD-10-CM | POA: Insufficient documentation

## 2024-02-20 ENCOUNTER — Ambulatory Visit: Payer: Self-pay | Admitting: Family Medicine

## 2024-03-07 ENCOUNTER — Encounter: Payer: Self-pay | Admitting: Neurology

## 2024-03-11 ENCOUNTER — Encounter: Payer: Self-pay | Admitting: Family Medicine

## 2024-03-12 NOTE — Addendum Note (Signed)
 Addended by: SEVERA ROCK HERO on: 03/12/2024 09:55 AM   Modules accepted: Orders

## 2024-03-20 ENCOUNTER — Other Ambulatory Visit: Payer: Self-pay | Admitting: Family Medicine

## 2024-03-20 DIAGNOSIS — D508 Other iron deficiency anemias: Secondary | ICD-10-CM

## 2024-03-21 ENCOUNTER — Ambulatory Visit (HOSPITAL_COMMUNITY)

## 2024-03-28 ENCOUNTER — Encounter: Payer: Self-pay | Admitting: Family Medicine

## 2024-03-31 ENCOUNTER — Telehealth: Payer: Self-pay | Admitting: Family Medicine

## 2024-03-31 NOTE — Telephone Encounter (Unsigned)
 Copied from CRM [id number]. Topic: Clinical - Medication Question >> Mar 31, 2024  2:51 PM Tobias L wrote: Reason for CRM: Patient inquiring if her prescription for zepbound  2.5mg  has been [facility]. Can be reached out to via mychart.

## 2024-04-01 ENCOUNTER — Encounter (HOSPITAL_COMMUNITY): Payer: Self-pay

## 2024-04-01 ENCOUNTER — Other Ambulatory Visit: Payer: Self-pay | Admitting: Family Medicine

## 2024-04-01 ENCOUNTER — Other Ambulatory Visit: Payer: Self-pay | Admitting: Nurse Practitioner

## 2024-04-01 ENCOUNTER — Ambulatory Visit (HOSPITAL_COMMUNITY)

## 2024-04-01 DIAGNOSIS — E7800 Pure hypercholesterolemia, unspecified: Secondary | ICD-10-CM

## 2024-04-01 DIAGNOSIS — R102 Pelvic and perineal pain unspecified side: Secondary | ICD-10-CM

## 2024-04-01 DIAGNOSIS — G4733 Obstructive sleep apnea (adult) (pediatric): Secondary | ICD-10-CM

## 2024-04-01 MED ORDER — TIRZEPATIDE-WEIGHT MANAGEMENT 2.5 MG/0.5ML ~~LOC~~ SOLN: 2.5000 mg | SUBCUTANEOUS | 1 refills | Status: DC

## 2024-04-11 ENCOUNTER — Ambulatory Visit
Admission: RE | Admit: 2024-04-11 | Discharge: 2024-04-11 | Disposition: A | Source: Ambulatory Visit | Attending: Nurse Practitioner | Admitting: Nurse Practitioner

## 2024-04-11 DIAGNOSIS — R102 Pelvic and perineal pain unspecified side: Secondary | ICD-10-CM

## 2024-04-16 ENCOUNTER — Other Ambulatory Visit: Payer: Self-pay | Admitting: Family Medicine

## 2024-04-16 DIAGNOSIS — Z9889 Other specified postprocedural states: Secondary | ICD-10-CM

## 2024-04-16 DIAGNOSIS — E0500 Thyrotoxicosis with diffuse goiter without thyrotoxic crisis or storm: Secondary | ICD-10-CM

## 2024-04-22 ENCOUNTER — Encounter: Payer: Self-pay | Admitting: Nurse Practitioner

## 2024-04-24 ENCOUNTER — Encounter: Payer: Self-pay | Admitting: Family Medicine

## 2024-04-24 ENCOUNTER — Ambulatory Visit: Admitting: Family Medicine

## 2024-04-24 DIAGNOSIS — E66812 Obesity, class 2: Secondary | ICD-10-CM

## 2024-04-24 DIAGNOSIS — G4733 Obstructive sleep apnea (adult) (pediatric): Secondary | ICD-10-CM | POA: Diagnosis not present

## 2024-04-24 DIAGNOSIS — E559 Vitamin D deficiency, unspecified: Secondary | ICD-10-CM

## 2024-04-24 DIAGNOSIS — Z9089 Acquired absence of other organs: Secondary | ICD-10-CM | POA: Diagnosis not present

## 2024-04-24 DIAGNOSIS — Z9889 Other specified postprocedural states: Secondary | ICD-10-CM

## 2024-04-24 DIAGNOSIS — Z6839 Body mass index (BMI) 39.0-39.9, adult: Secondary | ICD-10-CM | POA: Diagnosis not present

## 2024-04-24 DIAGNOSIS — E0500 Thyrotoxicosis with diffuse goiter without thyrotoxic crisis or storm: Secondary | ICD-10-CM

## 2024-04-24 MED ORDER — TIRZEPATIDE-WEIGHT MANAGEMENT 5 MG/0.5ML ~~LOC~~ SOLN: 5.0000 mg | SUBCUTANEOUS | 0 refills | Status: AC

## 2024-04-24 MED ORDER — TIRZEPATIDE-WEIGHT MANAGEMENT 10 MG/0.5ML ~~LOC~~ SOLN: 10.0000 mg | SUBCUTANEOUS | 0 refills | Status: AC

## 2024-04-24 MED ORDER — TIRZEPATIDE-WEIGHT MANAGEMENT 7.5 MG/0.5ML ~~LOC~~ SOLN: 7.5000 mg | SUBCUTANEOUS | 0 refills | Status: AC

## 2024-04-24 NOTE — Progress Notes (Signed)
 "    Subjective:  Patient ID: Jennifer Rosales, female    DOB: 09/27/1985, 39 y.o.   MRN: [id number]  Patient Care Team: Severa Rock HERO, FNP as PCP - General (Family Medicine) Rosalva Sawyer, MD (Obstetrics and Gynecology) Billee Mliss BIRCH, RPH-CPP as Pharmacist (Family Medicine)   Chief Complaint:  Weight Check   HPI: Jennifer Rosales is a 39 y.o. female presenting on 04/24/2024 for Weight Check    Jennifer Rosales is a 39 year old female with sleep apnea and hypothyroidism who presents for medication management and follow-up.  She is currently taking Zepbound  at a dose of 2.5 mg for weight management. She pays out-of-pocket as her insurance, [employer], does not cover it for sleep apnea. She tolerates the medication well without experiencing nausea, vomiting, or constipation. Since starting the medication, she has lost 8 pounds, with her current weight at 262 pounds.  She has a history of hypothyroidism and is awaiting an appointment with her endocrinologist in March. Her TSH levels were 26 in October and decreased to 20 in November. She describes feeling 'kind of eh' and can tell when her thyroid  is 'doing its thing'.   She is taking vitamin D  supplements and reports no muscle cramps or spasms.          Relevant past medical, surgical, family, and social history reviewed and updated as indicated.  Allergies and medications reviewed and updated. Data reviewed: Chart in Epic.   Past Medical History:  Diagnosis Date   Anemia    Anxiety    Depression, major, single episode, mild 08/17/2021   GERD (gastroesophageal reflux disease)    Graves disease    Graves disease    Obesity    Sleep apnea     Past Surgical History:  Procedure Laterality Date   CHOLECYSTECTOMY     GALLBLADDER SURGERY  2008   LAPAROSCOPIC GASTRIC SLEEVE RESECTION     THYROIDECTOMY  06/01/2020   TONSILLECTOMY     TONSILLECTOMY AND ADENOIDECTOMY     WISDOM TOOTH EXTRACTION      Social History    Socioeconomic History   Marital status: Married    Spouse name: Theadore GLENWOOD Beer   Number of children: 3   Years of education: Not on file   Highest education level: Associate degree: academic program  Occupational History   Not on file  Tobacco Use   Smoking status: Never   Smokeless tobacco: Never  Vaping Use   Vaping status: Never Used  Substance and Sexual Activity   Alcohol use: Not Currently   Drug use: Not Currently   Sexual activity: Yes  Other Topics Concern   Not on file  Social History Narrative   Living w husband, 3 kids--2 story home 15 steps.   Right hand    Caffeine soda 2 a week.   Social Drivers of Health   Tobacco Use: Low Risk (04/24/2024)   Patient History    Smoking Tobacco Use: Never    Smokeless Tobacco Use: Never    Passive Exposure: Not on file  Financial Resource Strain: Low Risk (02/11/2024)   Overall Financial Resource Strain (CARDIA)    Difficulty of Paying Living Expenses: Not very hard  Food Insecurity: No Food Insecurity (02/11/2024)   Epic    Worried About Programme Researcher, Broadcasting/film/video in the Last Year: Never true    Ran Out of Food in the Last Year: Never true  Transportation Needs: No Transportation Needs (02/11/2024)   Epic  Lack of Transportation (Medical): No    Lack of Transportation (Non-Medical): No  Physical Activity: Insufficiently Active (02/11/2024)   Exercise Vital Sign    Days of Exercise per Week: 4 days    Minutes of Exercise per Session: 30 min  Stress: Stress Concern Present (02/11/2024)   [employer] of Occupational Health - Occupational Stress Questionnaire    Feeling of Stress: To some extent  Social Connections: Socially Integrated (02/11/2024)   Social Connection and Isolation Panel    Frequency of Communication with Friends and Family: More than three times a week    Frequency of Social Gatherings with Friends and Family: More than three times a week    Attends Religious Services: More than 4 times per year     Active Member of [employer] or Organizations: Yes    Attends Engineer, Structural: More than 4 times per year    Marital Status: Married  Catering Manager Violence: Not At Risk (07/23/2023)   Received from [facility]   HITS    Over the last 12 months how often did your partner physically hurt you?: Never    Over the last 12 months how often did your partner insult you or talk down to you?: Never    Over the last 12 months how often did your partner threaten you with physical harm?: Never    Over the last 12 months how often did your partner scream or curse at you?: Never  Depression (PHQ2-9): Medium Risk (04/24/2024)   Depression (PHQ2-9)    PHQ-2 Score: 5  Alcohol Screen: Not on file  Housing: Low Risk (02/11/2024)   Epic    Unable to Pay for Housing in the Last Year: No    Number of Times Moved in the Last Year: 0    Homeless in the Last Year: No  Utilities: Low Risk (11/23/2023)   Received from [facility]   Utilities    Within the past 12 months, have you been unable to get utilities(heat, electricity) when it was really needed?: No  Health Literacy: Not on file    Outpatient Encounter Medications as of 04/24/2024  Medication Sig   calcitRIOL  (ROCALTROL ) 0.5 MCG capsule Take 0.5 mcg by mouth daily.   Cholecalciferol (VITAMIN D -3 PO) Take by mouth.   Ferrous Sulfate  (IRON ) 325 (65 Fe) MG TABS Take 1 tablet by mouth once daily   Multiple Vitamin (MULTIVITAMIN) tablet Take 1 tablet by mouth daily.   SYNTHROID  300 MCG tablet TAKE 1 TABLET BY MOUTH ONCE DAILY BEFORE BREAKFAST   [START ON 06/23/2024] tirzepatide  10 MG/0.5ML injection vial Inject 10 mg into the skin once a week for 4 doses.   tirzepatide  5 MG/0.5ML injection vial Inject 5 mg into the skin once a week for 4 doses.   [START ON 05/24/2024] tirzepatide  7.5 MG/0.5ML injection vial Inject 7.5 mg into the skin once a week for 4 doses.   [DISCONTINUED] tirzepatide  (ZEPBOUND ) 2.5 MG/0.5ML injection vial Inject 2.5 mg  into the skin once a week.   No facility-administered encounter medications on file as of 04/24/2024.    Allergies[1]  Pertinent ROS per HPI, otherwise unremarkable      Objective:  BP (!) 89/64   Pulse 78   Temp 98.6 F (37 C)   Ht 5' 8 (1.727 m)   Wt 262 lb 6.4 oz (119 kg)   LMP 04/01/2024 (Exact Date)   SpO2 96%   BMI 39.90 kg/m    Wt Readings from Last  3 Encounters:  04/24/24 262 lb 6.4 oz (119 kg)  02/12/24 270 lb 12.8 oz (122.8 kg)  01/02/24 270 lb (122.5 kg)    Physical Exam Vitals and nursing note reviewed.  Constitutional:      General: She is not in acute distress.    Appearance: Normal appearance. She is morbidly obese. She is not ill-appearing, toxic-appearing or diaphoretic.  HENT:     Head: Normocephalic and atraumatic.     Nose: Nose normal.     Mouth/Throat:     Mouth: Mucous membranes are moist.  Eyes:     Conjunctiva/sclera: Conjunctivae normal.     Pupils: Pupils are equal, round, and reactive to light.  Cardiovascular:     Rate and Rhythm: Normal rate and regular rhythm.     Heart sounds: Normal heart sounds.  Pulmonary:     Effort: Pulmonary effort is normal.     Breath sounds: Normal breath sounds.  Musculoskeletal:     Cervical back: Neck supple.     Right lower leg: No edema.     Left lower leg: No edema.  Skin:    General: Skin is warm and dry.     Capillary Refill: Capillary refill takes less than 2 seconds.  Neurological:     General: No focal deficit present.     Mental Status: She is alert and oriented to person, place, and time.  Psychiatric:        Mood and Affect: Mood normal.        Behavior: Behavior normal. Behavior is cooperative.        Thought Content: Thought content normal.        Judgment: Judgment normal.      Results for orders placed or performed in visit on 02/12/24  Bayer DCA Hb A1c Waived   Collection Time: 02/12/24  9:55 AM  Result Value Ref Range   HB A1C (BAYER DCA - WAIVED) 4.0 (L) 4.8 - 5.6 %   Anemia Profile B   Collection Time: 02/12/24  9:57 AM  Result Value Ref Range   Total Iron  Binding Capacity 262 250 - 450 ug/dL   UIBC 787 868 - 574 ug/dL   Iron  50 27 - 159 ug/dL   Iron  Saturation 19 15 - 55 %   Ferritin 107 15 - 150 ng/mL   Vitamin B-12 320 232 - 1,245 pg/mL   Folate 5.8 >3.0 ng/mL   WBC 7.5 3.4 - 10.8 x10E3/uL   RBC 5.00 3.77 - 5.28 x10E6/uL   Hemoglobin 14.6 11.1 - 15.9 g/dL   Hematocrit 55.1 65.9 - 46.6 %   MCV 90 79 - 97 fL   MCH 29.2 26.6 - 33.0 pg   MCHC 32.6 31.5 - 35.7 g/dL   RDW 86.8 88.2 - 84.5 %   Platelets 238 150 - 450 x10E3/uL   Neutrophils 62 Not Estab. %   Lymphs 28 Not Estab. %   Monocytes 7 Not Estab. %   Eos 2 Not Estab. %   Basos 1 Not Estab. %   Neutrophils Absolute 4.7 1.4 - 7.0 x10E3/uL   Lymphocytes Absolute 2.1 0.7 - 3.1 x10E3/uL   Monocytes Absolute 0.5 0.1 - 0.9 x10E3/uL   EOS (ABSOLUTE) 0.2 0.0 - 0.4 x10E3/uL   Basophils Absolute 0.0 0.0 - 0.2 x10E3/uL   Immature Granulocytes 0 Not Estab. %   Immature Grans (Abs) 0.0 0.0 - 0.1 x10E3/uL   Retic Ct Pct 1.4 0.6 - 2.6 %  CMP14+EGFR   Collection Time:  02/12/24  9:57 AM  Result Value Ref Range   Glucose 73 70 - 99 mg/dL   BUN 16 6 - 20 mg/dL   Creatinine, Ser 9.07 0.57 - 1.00 mg/dL   eGFR 83 >40 fO/fpw/8.26   BUN/Creatinine Ratio 17 9 - 23   Sodium 140 134 - 144 mmol/L   Potassium 3.9 3.5 - 5.2 mmol/L   Chloride 102 96 - 106 mmol/L   CO2 24 20 - 29 mmol/L   Calcium 8.3 (L) 8.7 - 10.2 mg/dL   Total Protein 6.4 6.0 - 8.5 g/dL   Albumin 4.2 3.9 - 4.9 g/dL   Globulin, Total 2.2 1.5 - 4.5 g/dL   Bilirubin Total 0.3 0.0 - 1.2 mg/dL   Alkaline Phosphatase 103 41 - 116 IU/L   AST 17 0 - 40 IU/L   ALT 12 0 - 32 IU/L  Lipid panel   Collection Time: 02/12/24  9:57 AM  Result Value Ref Range   Cholesterol, Total 190 100 - 199 mg/dL   Triglycerides 88 0 - 149 mg/dL   HDL 54 >60 mg/dL   VLDL Cholesterol Cal 16 5 - 40 mg/dL   LDL Chol Calc (NIH) 879 (H) 0 - 99 mg/dL   Chol/HDL  Ratio 3.5 0.0 - 4.4 ratio  TSH   Collection Time: 02/12/24  9:57 AM  Result Value Ref Range   TSH 20.000 (H) 0.450 - 4.500 uIU/mL  VITAMIN D  25 Hydroxy (Vit-D Deficiency, Fractures)   Collection Time: 02/12/24  9:57 AM  Result Value Ref Range   Vit D, 25-Hydroxy 25.6 (L) 30.0 - 100.0 ng/mL  T3, Free   Collection Time: 02/12/24  9:57 AM  Result Value Ref Range   T3, Free 2.2 2.0 - 4.4 pg/mL  T4, Free   Collection Time: 02/12/24  9:57 AM  Result Value Ref Range   Free T4 0.99 0.82 - 1.77 ng/dL       Pertinent labs & imaging results that were available during my care of the patient were reviewed by me and considered in my medical decision making.  Assessment & Plan:  Darenda was seen today for weight check.  Diagnoses and all orders for this visit:  Morbid (severe) obesity due to excess calories (HCC) -     CMP14+EGFR -     CBC with Differential/Platelet -     TSH -     VITAMIN D  25 Hydroxy (Vit-D Deficiency, Fractures) -     T4, Free -     T3, free -     tirzepatide  5 MG/0.5ML injection vial; Inject 5 mg into the skin once a week for 4 doses. -     tirzepatide  10 MG/0.5ML injection vial; Inject 10 mg into the skin once a week for 4 doses. -     tirzepatide  7.5 MG/0.5ML injection vial; Inject 7.5 mg into the skin once a week for 4 doses. -     AMB Referral [facility] Care Management  OSA on CPAP -     CMP14+EGFR -     CBC with Differential/Platelet -     tirzepatide  5 MG/0.5ML injection vial; Inject 5 mg into the skin once a week for 4 doses. -     tirzepatide  10 MG/0.5ML injection vial; Inject 10 mg into the skin once a week for 4 doses. -     tirzepatide  7.5 MG/0.5ML injection vial; Inject 7.5 mg into the skin once a week for 4 doses. -     AMB Referral [facility]  Care Management  Hypocalcemia -     CMP14+EGFR  Vitamin D  deficiency -     CMP14+EGFR -     VITAMIN D  25 Hydroxy (Vit-D Deficiency, Fractures)  S/P total thyroidectomy -     TSH -     T4, Free -     T3,  free  Graves disease -     TSH -     T4, Free -     T3, free      Morbid obesity due to excess calories Currently on Zepbound  2.5 mg with good tolerance. No nausea, vomiting, or constipation reported. Weight loss of 10-12 pounds since starting the medication. Insurance does not cover Zepbound  for sleep apnea, but she is paying out of pocket. - Increased Zetban to 5 mg for 4 weeks, then 7.5 mg for 4 weeks, and finally 10 mg. - Ordered labs to monitor kidney and liver function. - Scheduled follow-up in 3 months for BMI check. - Advised to report any significant side effects from dose increase.  Obstructive sleep apnea Insurance does not cover Zepbound  for sleep apnea. She is paying out of pocket for the medication. - Will discuss with pharmacist about potential insurance coverage options.  Post-thyroidectomy hypothyroidism TSH was 20 in November, down from 26 in October. Awaiting endocrinologist appointment in March. Reports feeling unwell, likely related to thyroid  function. - Ordered free T4, free T3, and TSH labs.  Vitamin D  deficiency Currently taking vitamin D  supplements.  Hypocalcemia No recent muscle cramps or spasms reported.       Continue all other maintenance medications.  Follow up plan: Return in 3 months (on 07/23/2024) for BMI.   Continue healthy lifestyle choices, including diet (rich in fruits, vegetables, and lean proteins, and low in salt and simple carbohydrates) and exercise (at least 30 minutes of moderate physical activity daily).  Educational handout given for Zepbound   The above assessment and management plan was discussed with the patient. The patient verbalized understanding of and has agreed to the management plan. Patient is aware to call the clinic if they develop any new symptoms or if symptoms persist or worsen. Patient is aware when to return to the clinic for a follow-up visit. Patient educated on when it is appropriate to go to the emergency  department.   Rosaline Bruns, FNP-C [facility] [phone]     [1]  Allergies Allergen Reactions   Ferric Carboxymaltose Rash   "

## 2024-04-25 ENCOUNTER — Encounter: Payer: Self-pay | Admitting: Family Medicine

## 2024-04-25 ENCOUNTER — Telehealth: Payer: Self-pay

## 2024-04-25 ENCOUNTER — Ambulatory Visit: Payer: Self-pay | Admitting: Family Medicine

## 2024-04-25 LAB — CBC WITH DIFFERENTIAL/PLATELET
Basophils Absolute: 0 x10E3/uL (ref 0.0–0.2)
Basos: 0 %
EOS (ABSOLUTE): 0.1 x10E3/uL (ref 0.0–0.4)
Eos: 2 %
Hematocrit: 47.6 % — ABNORMAL HIGH (ref 34.0–46.6)
Hemoglobin: 15.2 g/dL (ref 11.1–15.9)
Immature Grans (Abs): 0 x10E3/uL (ref 0.0–0.1)
Immature Granulocytes: 0 %
Lymphocytes Absolute: 1.9 x10E3/uL (ref 0.7–3.1)
Lymphs: 31 %
MCH: 29 pg (ref 26.6–33.0)
MCHC: 31.9 g/dL (ref 31.5–35.7)
MCV: 91 fL (ref 79–97)
Monocytes Absolute: 0.4 x10E3/uL (ref 0.1–0.9)
Monocytes: 7 %
Neutrophils Absolute: 3.7 x10E3/uL (ref 1.4–7.0)
Neutrophils: 60 %
Platelets: 219 x10E3/uL (ref 150–450)
RBC: 5.25 x10E6/uL (ref 3.77–5.28)
RDW: 13.6 % (ref 11.7–15.4)
WBC: 6.2 x10E3/uL (ref 3.4–10.8)

## 2024-04-25 LAB — T3, FREE: T3, Free: 2.4 pg/mL (ref 2.0–4.4)

## 2024-04-25 LAB — CMP14+EGFR
ALT: 16 IU/L (ref 0–32)
AST: 21 IU/L (ref 0–40)
Albumin: 4.4 g/dL (ref 3.9–4.9)
Alkaline Phosphatase: 86 IU/L (ref 41–116)
BUN/Creatinine Ratio: 18 (ref 9–23)
BUN: 13 mg/dL (ref 6–20)
Bilirubin Total: 0.4 mg/dL (ref 0.0–1.2)
CO2: 21 mmol/L (ref 20–29)
Calcium: 8.7 mg/dL (ref 8.7–10.2)
Chloride: 104 mmol/L (ref 96–106)
Creatinine, Ser: 0.73 mg/dL (ref 0.57–1.00)
Globulin, Total: 1.7 g/dL (ref 1.5–4.5)
Glucose: 75 mg/dL (ref 70–99)
Potassium: 4 mmol/L (ref 3.5–5.2)
Sodium: 141 mmol/L (ref 134–144)
Total Protein: 6.1 g/dL (ref 6.0–8.5)
eGFR: 109 mL/min/1.73

## 2024-04-25 LAB — T4, FREE: Free T4: 1.61 ng/dL (ref 0.82–1.77)

## 2024-04-25 LAB — TSH: TSH: 4.46 u[IU]/mL (ref 0.450–4.500)

## 2024-04-25 LAB — VITAMIN D 25 HYDROXY (VIT D DEFICIENCY, FRACTURES): Vit D, 25-Hydroxy: 23.2 ng/mL — AB (ref 30.0–100.0)

## 2024-04-25 NOTE — Progress Notes (Signed)
 Care Guide Pharmacy Note  04/25/2024 Name: Jennifer Rosales MRN: [id number] DOB: January 27, 1986  Referred By: Severa Rock HERO, FNP Reason for referral: Complex Care Management (Outreach to schedule with pharm d )   Jennifer Rosales is a 39 y.o. year old female who is a primary care patient of Severa Rock HERO, FNP.  Jennifer Rosales was referred to the pharmacist for assistance related to: sleep apnea  Successful contact was made with the patient to discuss pharmacy services including being ready for the pharmacist to call at least 5 minutes before the scheduled appointment time and to have medication bottles and any blood pressure readings ready for review. The patient agreed to meet with the pharmacist via telephone visit on (date/time).06/03/2024  Jeoffrey Buffalo , RMA     [place]  [facility], [facility] Guide  Direct Dial: [phone]  Website: [email or web address]

## 2024-05-01 ENCOUNTER — Other Ambulatory Visit: Payer: Self-pay | Admitting: Nurse Practitioner

## 2024-05-01 DIAGNOSIS — R1011 Right upper quadrant pain: Secondary | ICD-10-CM

## 2024-05-01 DIAGNOSIS — R10817 Generalized abdominal tenderness: Secondary | ICD-10-CM

## 2024-05-01 DIAGNOSIS — R102 Pelvic and perineal pain unspecified side: Secondary | ICD-10-CM

## 2024-05-02 ENCOUNTER — Encounter: Payer: Self-pay | Admitting: Neurology

## 2024-05-02 ENCOUNTER — Telehealth: Admitting: Neurology

## 2024-05-02 VITALS — Ht 68.0 in | Wt 260.0 lb

## 2024-05-02 DIAGNOSIS — R202 Paresthesia of skin: Secondary | ICD-10-CM

## 2024-05-02 DIAGNOSIS — R42 Dizziness and giddiness: Secondary | ICD-10-CM

## 2024-05-02 NOTE — Patient Instructions (Signed)
 Good to see you.  Schedule EMG/NCV of both upper extremities  2. Follow-up in 6 months, call for any changes

## 2024-05-02 NOTE — Addendum Note (Signed)
 Addended by: TAFT MOATS on: 05/02/2024 12:03 PM   Modules accepted: Orders

## 2024-05-02 NOTE — Progress Notes (Signed)
 "  Virtual Visit via Video Note  This visit type was conducted with patient consent. This format is felt to be most appropriate for this patient at this time. Physical exam was limited by quality of the video and audio technology used for the visit.    Consent was obtained for video visit:  Yes.   Answered questions that patient had about telehealth interaction:  Yes.   Patient is aware of the limitations, risks, security and privacy concerns of performing an evaluation and management service by telemedicine. The patient expressed understanding and agreed to proceed.  Pt location: Home Physician Location: office Name of referring provider:  Severa Rock HERO, FNP I connected with Morna JINNY Saba at patients initiation/request on 05/02/2024 at  4:00 PM EST by video enabled telemedicine application and verified that I am speaking with the correct person using two identifiers. Pt MRN:  [id number] Pt DOB:  [birth year] Video Participants:  Morna JINNY Saba   History of Present Illness:  The patient had a virtual video visit on 05/02/2024. She was last seen 3 months ago for dizziness. I personally reviewed MRI brain without contrast done 01/2024 which was normal. She was noted to have orthostatic hypotension on her initial visit, likely contributing to dizziness. Her TSH was also abnormal, 26.04 in 01/2024. Last TSH normal 4.460 earlier this month. She went to vestibular therapy twice, notes indicate she demonstrated increased vestibular symptoms with provocative testing. She reports that after the second session, she has not had any further dizziness. No loss of consciousness since July.   Main concern today is the tingling and numbness in her arms and fingers, waking her up from sleep. Symptoms are worse on the left upper extremity. It feels like her hands are asleep and takes a while to quite down. She denies any neck pain. No hand weakness. Sometimes she also has numbness in her foot, mostly the right  foot. No falls.   History on Initial Assessment 01/02/2024: This is a 39 year old right-handed woman with a history of Graves disease, OSA, gastric sleeve surgery, depression, anxiety, presenting for evaluation of dizziness. Symptoms started over the past year, she would have episodes occurring multiple times a week where she feels lightheaded. It starts with feeling like she will have an anxiety attack, then starts feeling lightheaded and queasy. She has to hold on for a few minutes, close her eyes, and regroup. It has happened when going from sitting to standing, but she has had it while driving moving her head side to side, she has had to pull over and close her eyes. If she stays still and keeps her head straight, she feels fine. When she is in the grocery, she has to hold on but feels fine looking straight. Her legs feel like she is swaying but she is not moving (sea legs). She had a syncopal episode in July while her son was in the hospital, she stood up and felt like she would pass out then lost consciousness.  She has episodes where it feels like inside her body is fixing to have an anxiety attack, it feels like bubbled gas, with pain in her chest and behind her shoulder. She also has leg cramps. These occur at least 3 night a week. She has tingling in the fingers of both hands and feels like her arms and legs are asleep. She has had swelling in her legs and ankles a few times in the summer. She has night sweats, and also feels hot  and sweaty while at work, separate from the dizziness. Over the past year, she has had headaches at least 1-2 times a week with throbbing over the left temporal region. No nausea/vomiting, she is sensitive to lights. Tylenol  usually helps. Last month was the worst, it lasted 2 days with not much response to OTC medication. Headaches are independent of the dizziness. Her paternal grandmother had migraines. No diplopia, she has right eye floaters. No dysarthria/dysphagia,  neck/back pain, bowel/bladder dysfunction. She always feels tired. She reports a 20-lb weight gain since March despite no change in diet. She has intermittent low-pitched tinnitus, she feels she does not hear everything. She works in an office at school. Her brother had a brain tumor. Paternal grandmother had neuropathy.   Last bloodwork on [facility] for thyroid  panel (06/2023: TSH <0.005, T4 14.2. She states she has not had medication adjustment.     Medications Ordered Prior to Encounter[1]   Observations/Objective:   Vitals:   05/02/24 0959  Weight: 260 lb (117.9 kg)  Height: 5' 8 (1.727 m)   GEN:  The patient appears stated age and is in NAD. Neurological examination: Patient is awake, alert. No aphasia or dysarthria. Intact fluency and comprehension. Cranial nerves: Extraocular movements intact. No facial asymmetry. Motor: moves all extremities symmetrically, at least anti-gravity x 4.   Assessment and Plan:   This is a 39 yo RH woman with a history of Graves disease, OSA, gastric sleeve surgery, depression, anxiety, who presented for dizziness described as lightheadedness with some positional component. Brain MRI normal, dizziness resolved with vestibular therapy. She is reporting continued paresthesias in her upper extremities, worse on the left, as well as the right foot. Neurological exam on initial showed mild neuropathy. Her TSH was initially abnormal, recent TSH normal. We discussed doing EMG/NCV of both upper extremities to further evaluate symptoms. She asks about genetic component as both grandmother had neuropathy (severe in one). Follow-up in 6 months, call for any changes.    Follow Up Instructions:   -I discussed the assessment and treatment plan with the patient. The patient was provided an opportunity to ask questions and all were answered. The patient agreed with the plan and demonstrated an understanding of the instructions.   The patient was advised to call back or seek  an in-person evaluation if the symptoms worsen or if the condition fails to improve as anticipated.     Darice CHRISTELLA Shivers, MD     [1]  Current Outpatient Medications on File Prior to Visit  Medication Sig Dispense Refill   calcitRIOL  (ROCALTROL ) 0.5 MCG capsule Take 0.5 mcg by mouth daily.     Cholecalciferol (VITAMIN D -3 PO) Take by mouth.     Ferrous Sulfate  (IRON ) 325 (65 Fe) MG TABS Take 1 tablet by mouth once daily 30 tablet 2   Multiple Vitamin (MULTIVITAMIN) tablet Take 1 tablet by mouth daily.     SYNTHROID  300 MCG tablet TAKE 1 TABLET BY MOUTH ONCE DAILY BEFORE BREAKFAST 90 tablet 1   [START ON 06/23/2024] tirzepatide  10 MG/0.5ML injection vial Inject 10 mg into the skin once a week for 4 doses. (Patient not taking: Reported on 05/02/2024) 2 mL 0   tirzepatide  5 MG/0.5ML injection vial Inject 5 mg into the skin once a week for 4 doses. (Patient not taking: Reported on 05/02/2024) 2 mL 0   [START ON 05/24/2024] tirzepatide  7.5 MG/0.5ML injection vial Inject 7.5 mg into the skin once a week for 4 doses. (Patient not taking: Reported on  05/02/2024) 2 mL 0   No current facility-administered medications on file prior to visit.   "

## 2024-05-14 ENCOUNTER — Ambulatory Visit: Admitting: Family Medicine

## 2024-05-19 ENCOUNTER — Other Ambulatory Visit

## 2024-05-30 ENCOUNTER — Other Ambulatory Visit

## 2024-06-03 ENCOUNTER — Other Ambulatory Visit

## 2024-06-26 ENCOUNTER — Encounter: Payer: Self-pay | Admitting: Neurology

## 2024-07-29 ENCOUNTER — Ambulatory Visit: Admitting: Family Medicine

## 2024-10-21 ENCOUNTER — Ambulatory Visit: Payer: Self-pay | Admitting: Neurology
